# Patient Record
Sex: Male | Born: 1970 | Race: White | Hispanic: No | Marital: Married | Smoking: Never smoker
Health system: Southern US, Community
[De-identification: ages and names within clinical notes are randomized; demographics above are authoritative.]

## PROBLEM LIST (undated history)

## (undated) DIAGNOSIS — M51369 Other intervertebral disc degeneration, lumbar region without mention of lumbar back pain or lower extremity pain: Secondary | ICD-10-CM

## (undated) DIAGNOSIS — M5412 Radiculopathy, cervical region: Secondary | ICD-10-CM

## (undated) DIAGNOSIS — I10 Essential (primary) hypertension: Secondary | ICD-10-CM

## (undated) DIAGNOSIS — Z6839 Body mass index (BMI) 39.0-39.9, adult: Secondary | ICD-10-CM

## (undated) HISTORY — PX: HERNIA REPAIR: SHX51

## (undated) HISTORY — PX: HYDROCELE EXCISION / REPAIR: SUR1145

## (undated) HISTORY — DX: Essential (primary) hypertension: I10

---

## 2006-01-02 ENCOUNTER — Emergency Department: Payer: Self-pay | Admitting: Emergency Medicine

## 2006-10-14 HISTORY — PX: KNEE CARTILAGE SURGERY: SHX688

## 2007-07-28 IMAGING — CR DG TIBIA/FIBULA 2V*L*
1 series · 3 of 3 positions shown · non-contrast
Comparison: none

REASON FOR EXAM: Having pain after getting leg caught between two cars at
work
COMMENTS:

PROCEDURE:     DXR - DXR TIBIA AND FIBULA LT (LOWER L  - January 02, 2006  [DATE]
RESULT:     AP and lateral views of the LEFT tibia and fibula show no
fracture or other significant osseous abnormality.  No radiodense soft
tissue foreign body is seen.

[Series 1: view not recorded · 0.17mm/px · 3 of 3 slices shown]
[im 1/3]
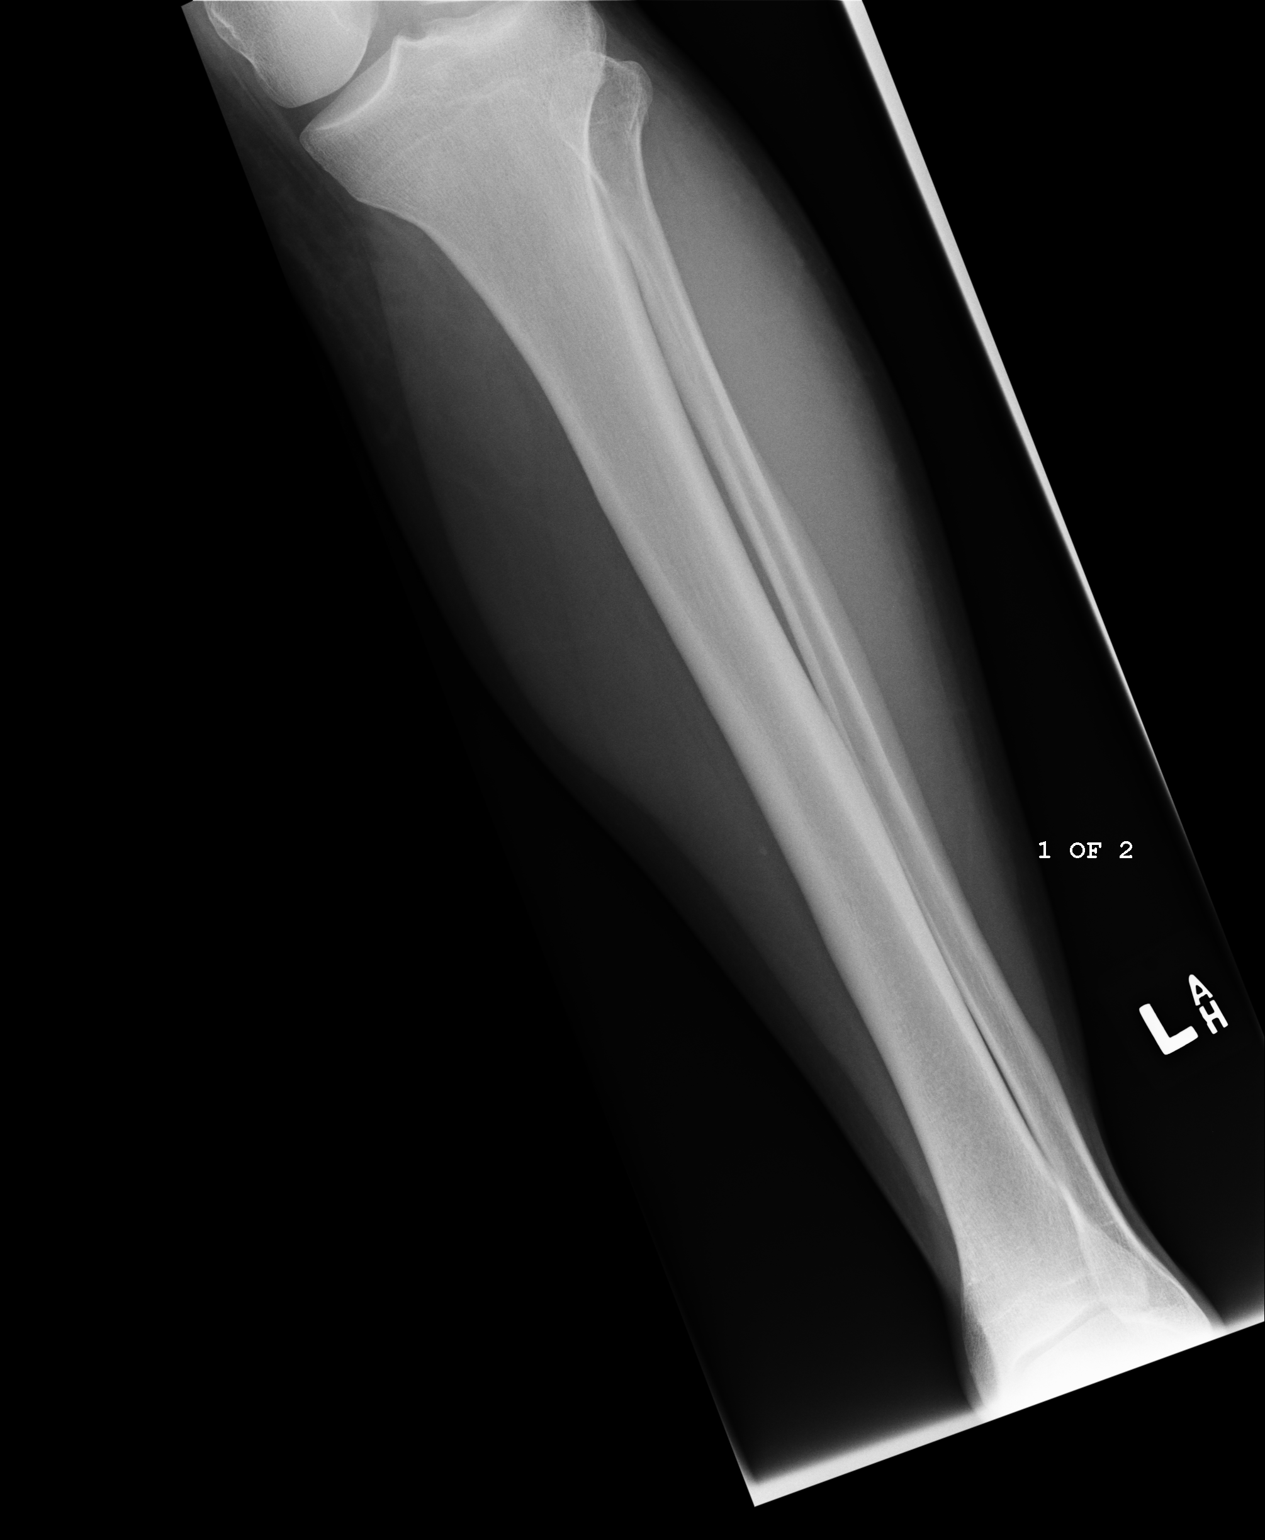
[im 2/3]
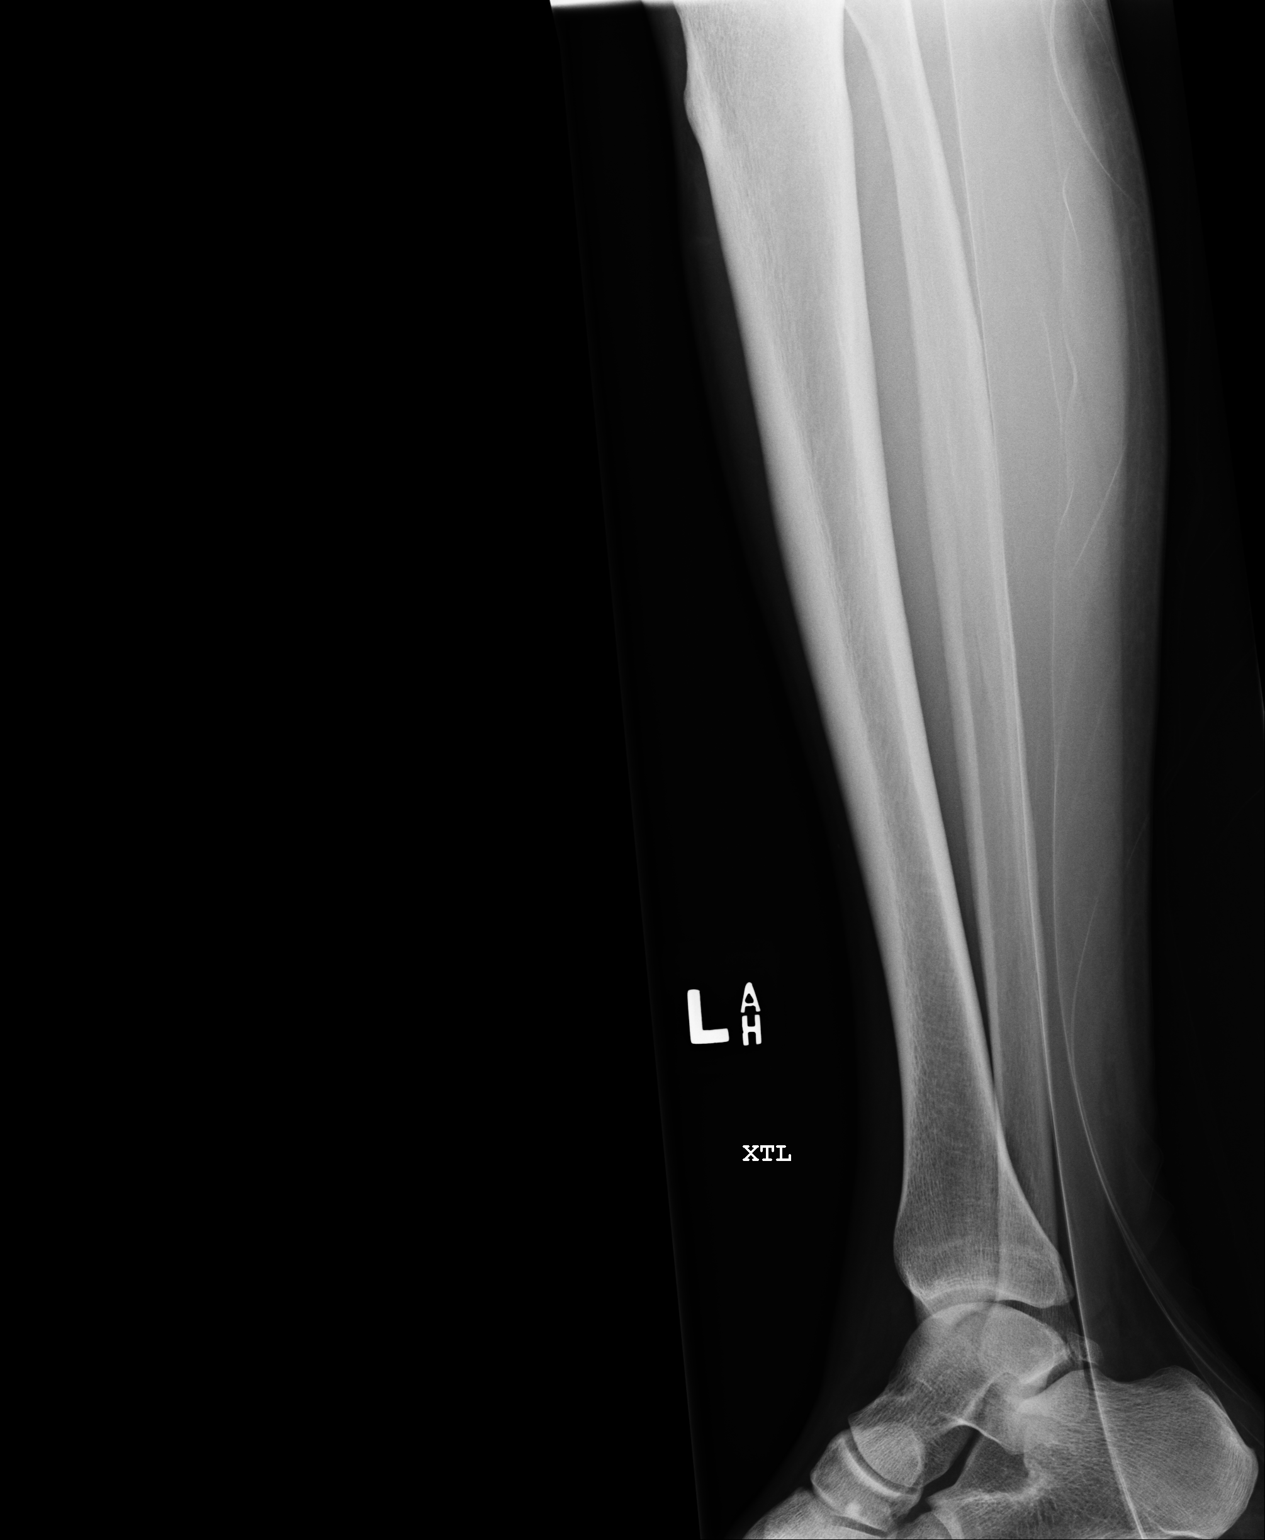
[im 3/3]
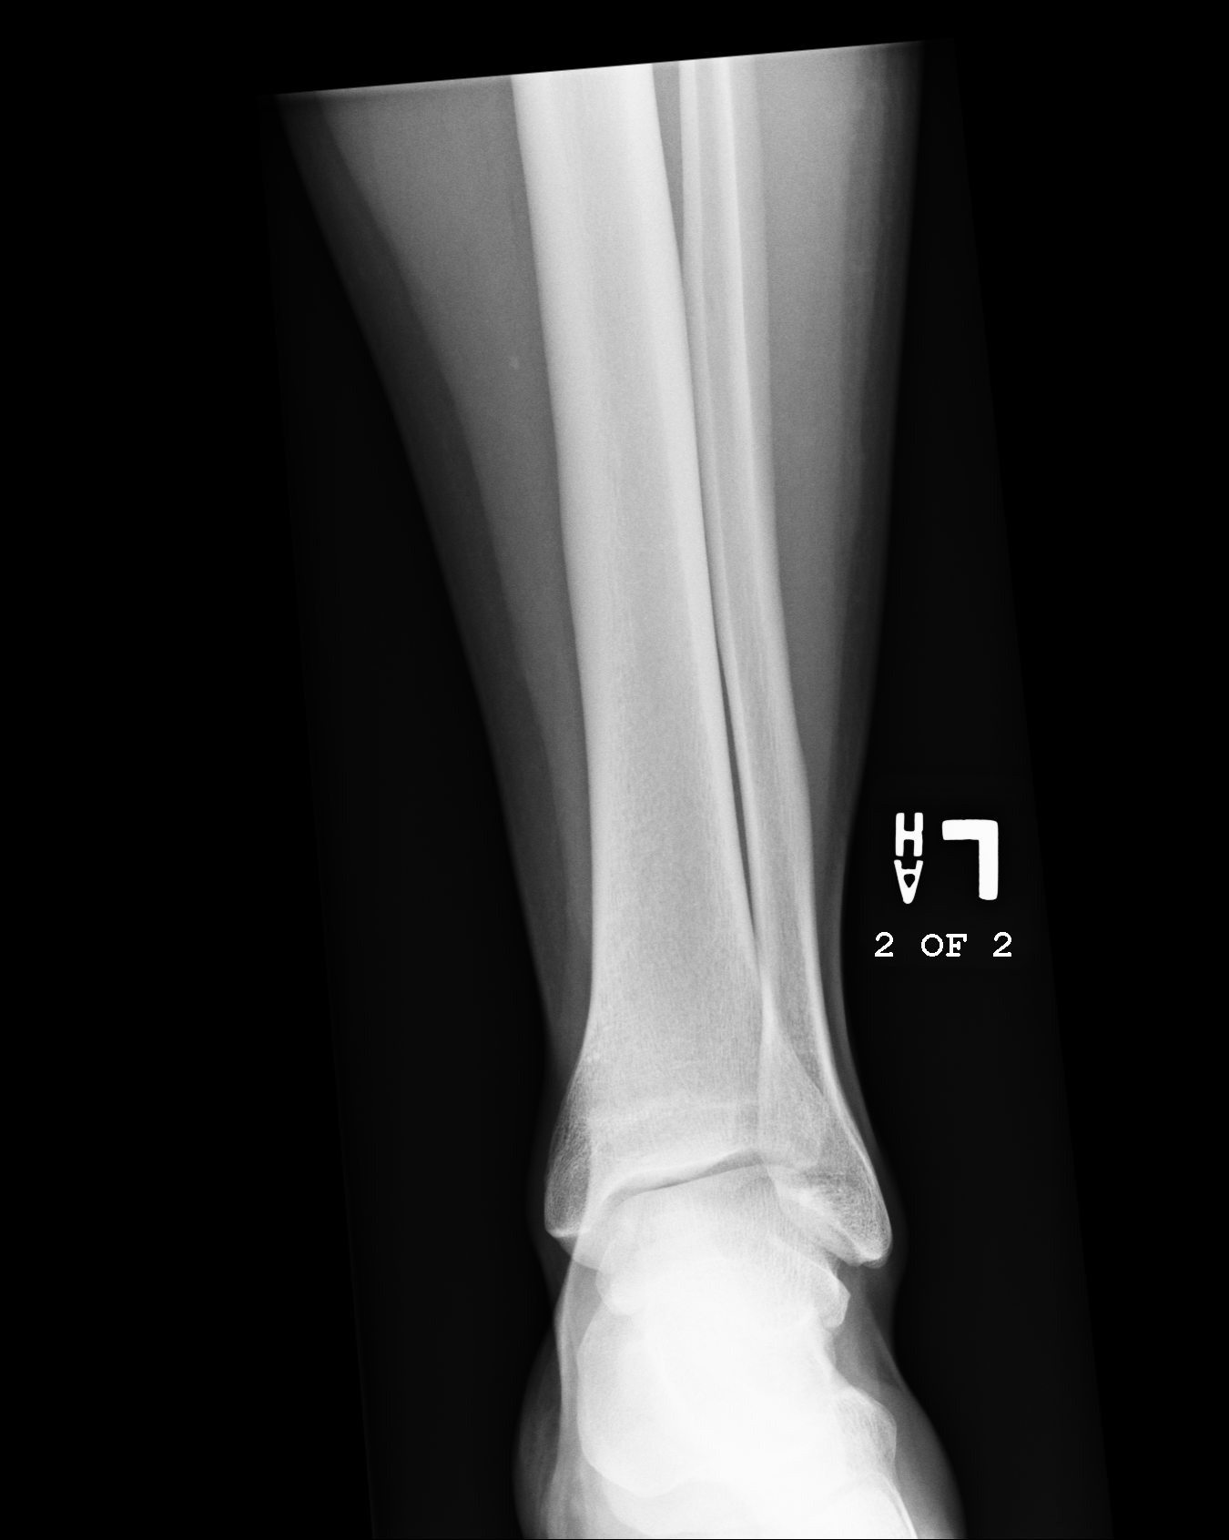

[3 of 3 positions shown; findings below may reference images not displayed]

IMPRESSION: No significant abnormalities are noted.

## 2007-07-28 IMAGING — CR DG KNEE 1-2V*L*
1 series · 2 of 2 positions shown · non-contrast
Comparison: none

REASON FOR EXAM: Pain after getting knee caught between two cars at work
COMMENTS:

PROCEDURE:     DXR - DXR KNEE LEFT AP AND LATERAL  - January 02, 2006  [DATE]
RESULT:          AP and lateral views of the LEFT knee show no fracture,
dislocation, or other acute bony abnormality.

[Series 1: view not recorded · 0.17mm/px · 2 of 2 slices shown]
[im 1/2]
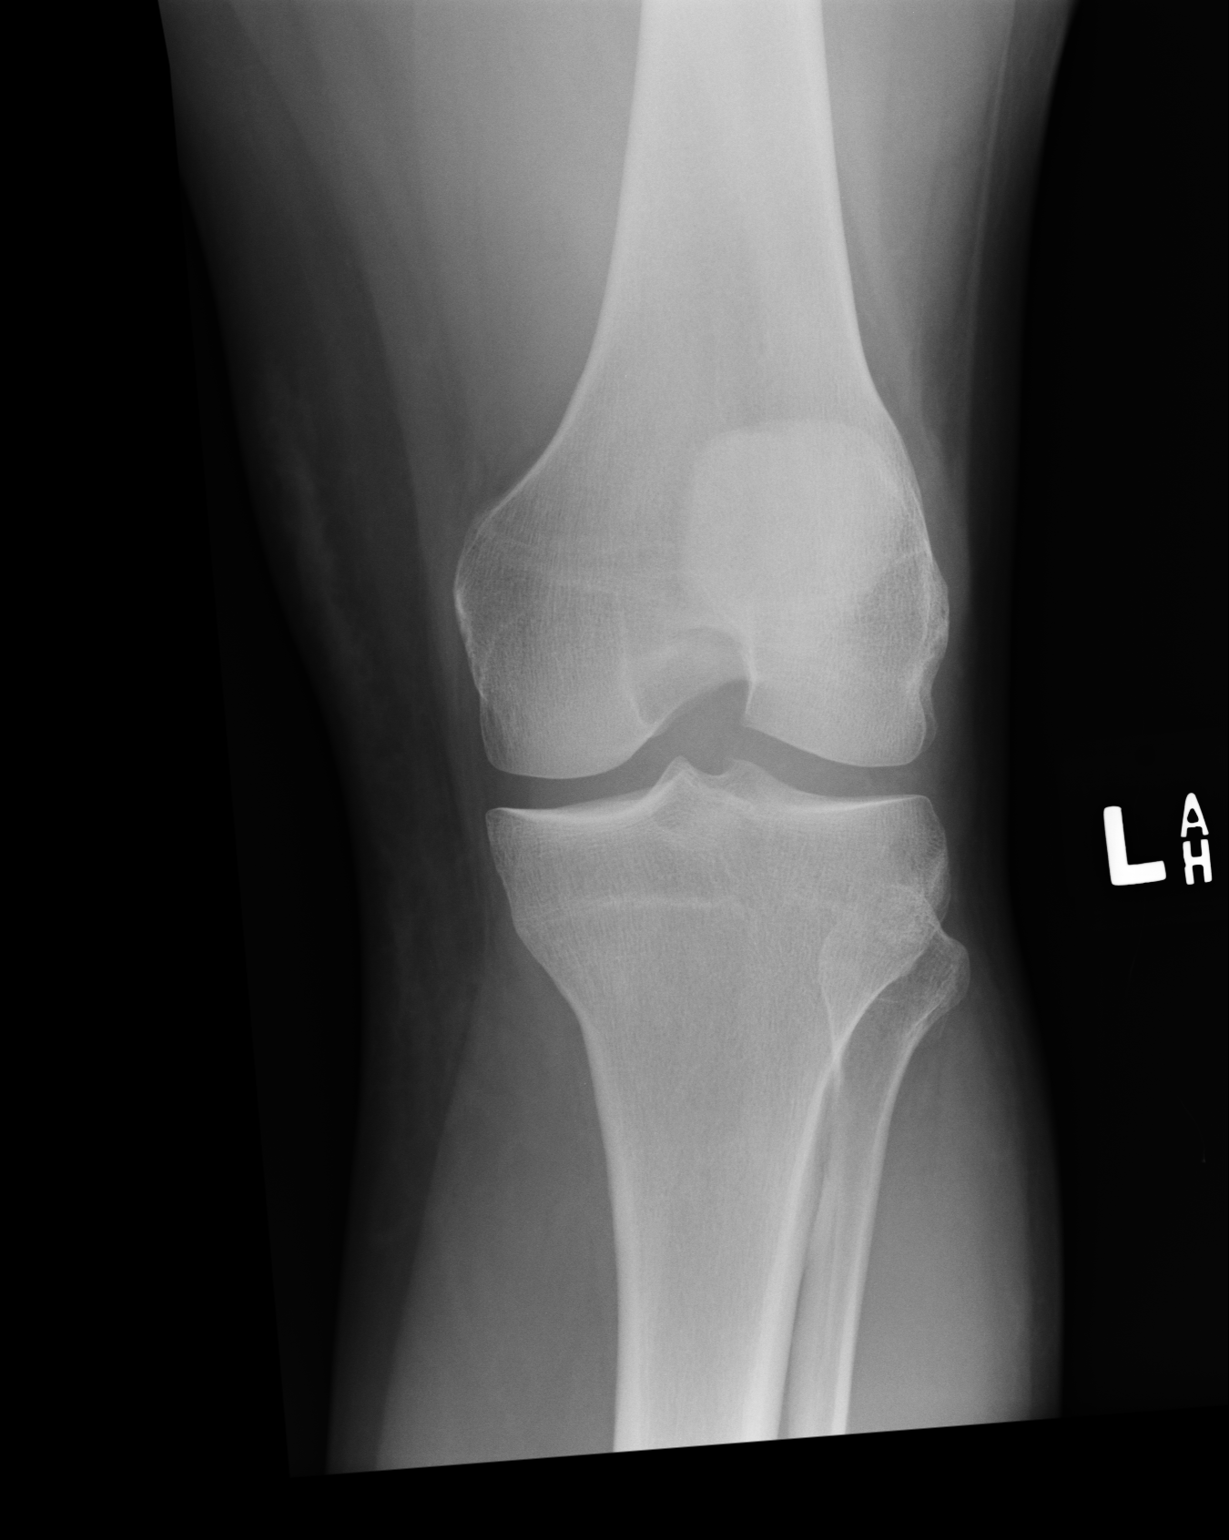
[im 2/2]
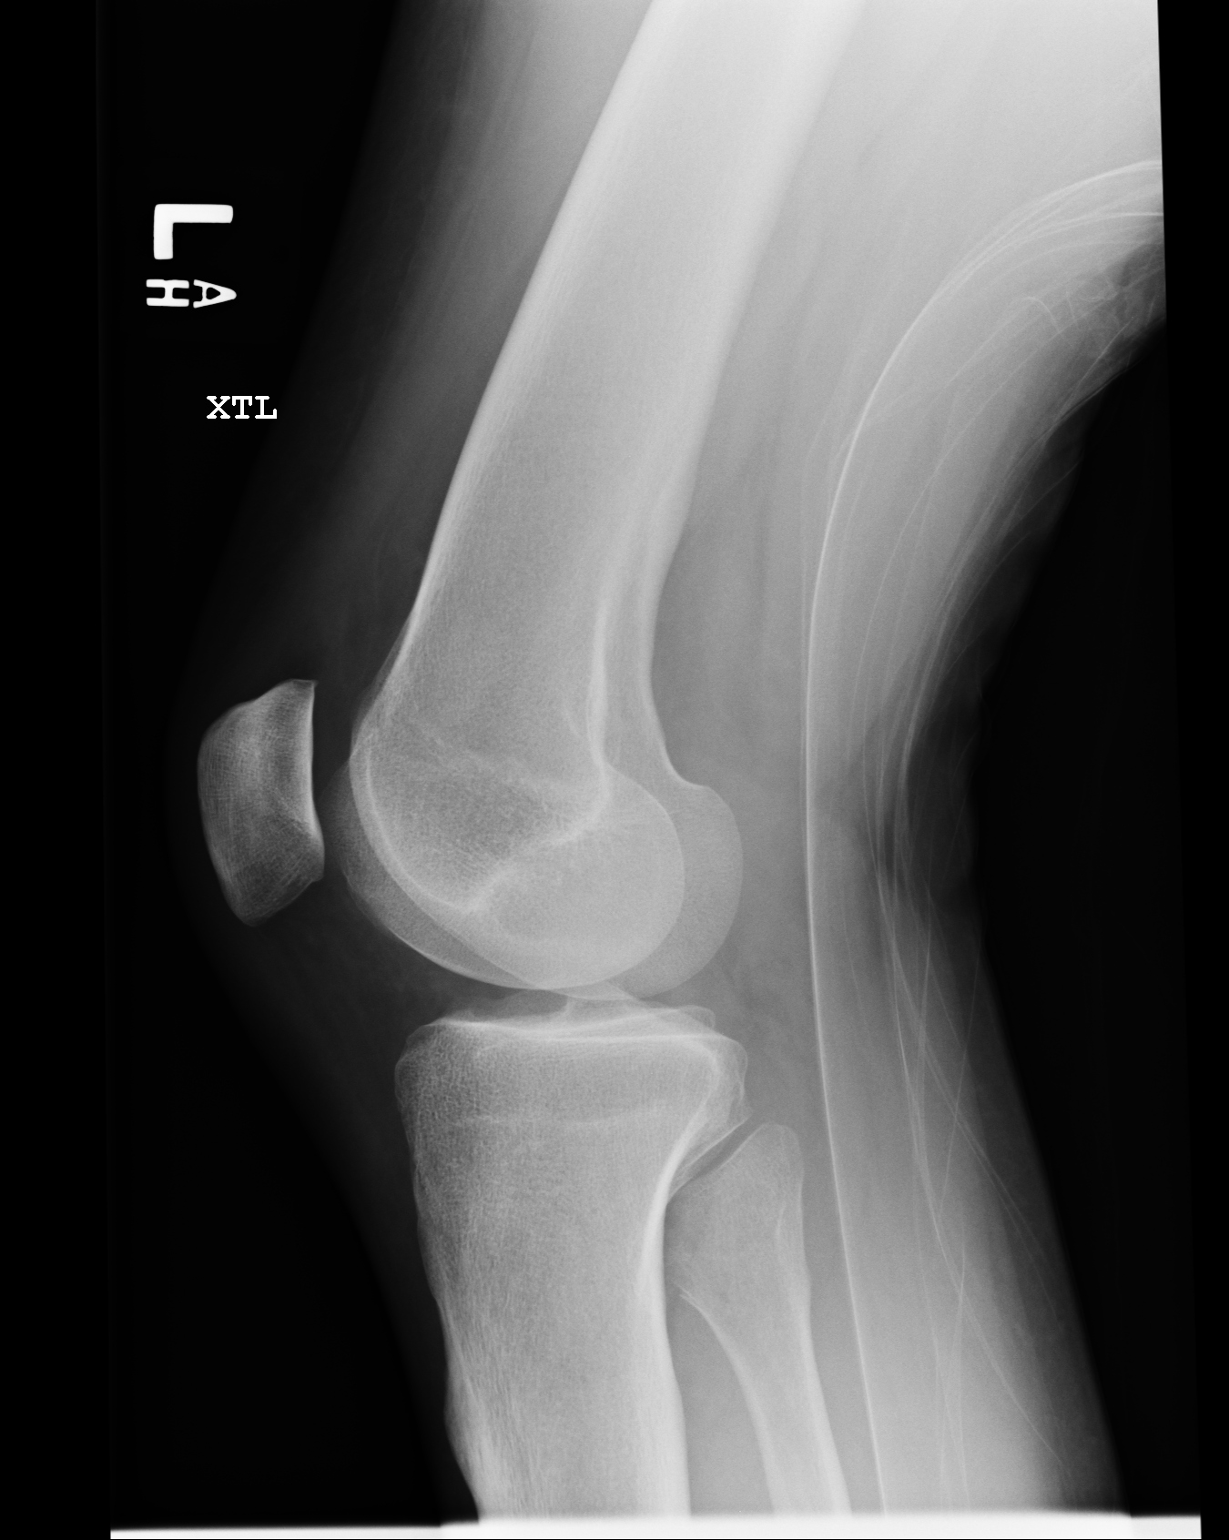

[2 of 2 positions shown; findings below may reference images not displayed]

IMPRESSION: No significant osseous abnormalities are noted.

## 2015-03-29 ENCOUNTER — Ambulatory Visit (INDEPENDENT_AMBULATORY_CARE_PROVIDER_SITE_OTHER): Payer: 59 | Admitting: Family Medicine

## 2015-03-29 ENCOUNTER — Encounter: Payer: Self-pay | Admitting: Family Medicine

## 2015-03-29 ENCOUNTER — Other Ambulatory Visit: Payer: 59

## 2015-03-29 VITALS — BP 138/84 | HR 59 | Temp 97.7°F | Ht 70.0 in | Wt 218.0 lb

## 2015-03-29 DIAGNOSIS — Z0000 Encounter for general adult medical examination without abnormal findings: Secondary | ICD-10-CM | POA: Diagnosis not present

## 2015-03-29 DIAGNOSIS — I10 Essential (primary) hypertension: Secondary | ICD-10-CM

## 2015-03-29 LAB — URINALYSIS, ROUTINE W REFLEX MICROSCOPIC
Bilirubin, UA: NEGATIVE
Glucose, UA: NEGATIVE
Ketones, UA: NEGATIVE
LEUKOCYTES UA: NEGATIVE
Nitrite, UA: NEGATIVE
Protein, UA: NEGATIVE
RBC, UA: NEGATIVE
SPEC GRAV UA: 1.03 (ref 1.005–1.030)
Urobilinogen, Ur: 0.2 mg/dL (ref 0.2–1.0)
pH, UA: 5.5 (ref 5.0–7.5)

## 2015-03-29 MED ORDER — AMLODIPINE BESYLATE 5 MG PO TABS: 5.0000 mg | ORAL_TABLET | Freq: Every day | ORAL | Status: DC

## 2015-03-29 NOTE — Assessment & Plan Note (Signed)
The current medical regimen is effective;  continue present plan and medications.  

## 2015-03-29 NOTE — Progress Notes (Signed)
   BP 138/84 mmHg  Pulse 59  Temp(Src) 97.7 F (36.5 C)  Ht 5\' 10"  (1.778 m)  Wt 218 lb (98.884 kg)  BMI 31.28 kg/m2  SpO2 96%   Subjective:    Patient ID: Isaiah Terrell, male    DOB: Jan 19, 1971, 44 y.o.   MRN: [id number]  HPI: Isaiah Terrell is a 44 y.o. male  Chief Complaint  Patient presents with  . Annual Exam  bp doing well meds no issues  Relevant past medical, surgical, family and social history reviewed and updated as indicated. Interim medical history since our last visit reviewed. Allergies and medications reviewed and updated.  Review of Systems  Constitutional: Negative.   HENT: Negative.   Eyes: Negative.   Respiratory: Negative.   Cardiovascular: Negative.   Endocrine: Negative.   Musculoskeletal: Negative.   Skin: Negative.   Allergic/Immunologic: Negative.   Neurological: Negative.   Hematological: Negative.   Psychiatric/Behavioral: Negative.     Per HPI unless specifically indicated above     Objective:    BP 138/84 mmHg  Pulse 59  Temp(Src) 97.7 F (36.5 C)  Ht 5\' 10"  (1.778 m)  Wt 218 lb (98.884 kg)  BMI 31.28 kg/m2  SpO2 96%  Wt Readings from Last 3 Encounters:  03/29/15 218 lb (98.884 kg)  10/19/14 228 lb (103.42 kg)    Physical Exam  Constitutional: He is oriented to person, place, and time. He appears well-developed and well-nourished.  HENT:  Head: Normocephalic and atraumatic.  Right Ear: External ear normal.  Left Ear: External ear normal.  Eyes: Conjunctivae and EOM are normal. Pupils are equal, round, and reactive to light.  Neck: Normal range of motion. Neck supple.  Cardiovascular: Normal rate, regular rhythm, normal heart sounds and intact distal pulses.   Pulmonary/Chest: Effort normal and breath sounds normal.  Abdominal: Soft. Bowel sounds are normal. There is no splenomegaly or hepatomegaly.  Genitourinary: Rectum normal, prostate normal and penis normal.  Musculoskeletal: Normal range of motion.  Neurological: He is  alert and oriented to person, place, and time. He has normal reflexes.  Skin: No rash noted. No erythema.  Psychiatric: He has a normal mood and affect. His behavior is normal. Judgment and thought content normal.    No results found for this or any previous visit.    Assessment & Plan:   Problem List Items Addressed This Visit      Cardiovascular and Mediastinum   Hypertension - Primary (Chronic)    The current medical regimen is effective;  continue present plan and medications.       Relevant Medications   amLODipine (NORVASC) 5 MG tablet   Other Relevant Orders   Basic metabolic panel   CBC with Differential/Platelet   Comprehensive metabolic panel   PSA   Lipid panel   TSH   Urinalysis, Routine w reflex microscopic (not at [facility])    Other Visit Diagnoses    PE (physical exam), annual        Relevant Orders    Basic metabolic panel    CBC with Differential/Platelet    Comprehensive metabolic panel    PSA    Lipid panel    TSH    Urinalysis, Routine w reflex microscopic (not at [facility])        Follow up plan: Return in about 6 months (around 09/28/2015) for BP.

## 2015-03-30 LAB — LIPID PANEL
Chol/HDL Ratio: 5.7 ratio units — ABNORMAL HIGH (ref 0.0–5.0)
Cholesterol, Total: 187 mg/dL (ref 100–199)
HDL: 33 mg/dL — ABNORMAL LOW (ref >39)
LDL Calculated: 114 mg/dL — ABNORMAL HIGH (ref 0–99)
TRIGLYCERIDES: 200 mg/dL — AB (ref 0–149)
VLDL Cholesterol Cal: 40 mg/dL (ref 5–40)

## 2015-03-30 LAB — CBC WITH DIFFERENTIAL/PLATELET
Basophils Absolute: 0 10*3/uL (ref 0.0–0.2)
Basos: 0 %
EOS (ABSOLUTE): 0 10*3/uL (ref 0.0–0.4)
EOS: 0 %
Hematocrit: 45.9 % (ref 37.5–51.0)
Hemoglobin: 15.9 g/dL (ref 12.6–17.7)
IMMATURE GRANULOCYTES: 0 %
Immature Grans (Abs): 0 10*3/uL (ref 0.0–0.1)
LYMPHS: 12 %
Lymphocytes Absolute: 1.2 10*3/uL (ref 0.7–3.1)
MCH: 29.4 pg (ref 26.6–33.0)
MCHC: 34.6 g/dL (ref 31.5–35.7)
MCV: 85 fL (ref 79–97)
Monocytes Absolute: 0.3 10*3/uL (ref 0.1–0.9)
Monocytes: 3 %
Neutrophils Absolute: 8.5 10*3/uL — ABNORMAL HIGH (ref 1.4–7.0)
Neutrophils: 85 %
PLATELETS: 275 10*3/uL (ref 150–379)
RBC: 5.41 x10E6/uL (ref 4.14–5.80)
RDW: 13.9 % (ref 12.3–15.4)
WBC: 10 10*3/uL (ref 3.4–10.8)

## 2015-03-30 LAB — COMPREHENSIVE METABOLIC PANEL
A/G RATIO: 1.9 (ref 1.1–2.5)
ALT: 35 IU/L (ref 0–44)
AST: 23 IU/L (ref 0–40)
Albumin: 4.5 g/dL (ref 3.5–5.5)
Alkaline Phosphatase: 72 IU/L (ref 39–117)
BUN / CREAT RATIO: 16 (ref 9–20)
BUN: 15 mg/dL (ref 6–24)
Bilirubin Total: 0.8 mg/dL (ref 0.0–1.2)
CO2: 23 mmol/L (ref 18–29)
CREATININE: 0.94 mg/dL (ref 0.76–1.27)
Calcium: 10.1 mg/dL (ref 8.7–10.2)
Chloride: 97 mmol/L (ref 97–108)
GFR, EST AFRICAN AMERICAN: 113 mL/min/{1.73_m2} (ref >59)
GFR, EST NON AFRICAN AMERICAN: 98 mL/min/{1.73_m2} (ref >59)
GLUCOSE: 104 mg/dL — AB (ref 65–99)
Globulin, Total: 2.4 g/dL (ref 1.5–4.5)
Potassium: 4.8 mmol/L (ref 3.5–5.2)
Sodium: 137 mmol/L (ref 134–144)
Total Protein: 6.9 g/dL (ref 6.0–8.5)

## 2015-03-30 LAB — PSA: Prostate Specific Ag, Serum: 1.1 ng/mL (ref 0.0–4.0)

## 2015-03-30 LAB — TSH: TSH: 2.62 u[IU]/mL (ref 0.450–4.500)

## 2015-09-27 ENCOUNTER — Ambulatory Visit (INDEPENDENT_AMBULATORY_CARE_PROVIDER_SITE_OTHER): Payer: 59 | Admitting: Family Medicine

## 2015-09-27 ENCOUNTER — Encounter: Payer: Self-pay | Admitting: Family Medicine

## 2015-09-27 VITALS — BP 139/87 | HR 84 | Temp 98.4°F | Ht 70.5 in | Wt 228.0 lb

## 2015-09-27 DIAGNOSIS — I10 Essential (primary) hypertension: Secondary | ICD-10-CM | POA: Diagnosis not present

## 2015-09-27 DIAGNOSIS — M545 Low back pain, unspecified: Secondary | ICD-10-CM

## 2015-09-27 NOTE — Assessment & Plan Note (Signed)
Borderline control of blood pressure will do diet and exercise modification Discuss modest salt etc. If blood pressure not coming down we will reevaluate consider different medical therapies

## 2015-09-27 NOTE — Progress Notes (Signed)
BP 139/87 mmHg  Pulse 84  Temp(Src) 98.4 F (36.9 C)  Ht 5' 10.5" (1.791 m)  Wt 228 lb (103.42 kg)  BMI 32.24 kg/m2  SpO2 97%   Subjective:    Patient ID: Isaiah Terrell, male    DOB: 05/19/1971, 44 y.o.   MRN: [id number]  HPI: Isaiah Terrell is a 44 y.o. male  Chief Complaint  Patient presents with  . Hypertension   Patient recheck hypertension doing well has had 10 pound weight gain without problems but is noted blood pressure readings similar to what we have today borderline to high. On chart review previous blood pressure 10 pounds lighter were okay  Patient also notes concern with some left lower back pain from time to time with marked catching sensation with feeling like it's going to drop to his knees but does not times last for a few seconds to a minute and goes away and comes on from time to time. No blood in stool or urine  Relevant past medical, surgical, family and social history reviewed and updated as indicated. Interim medical history since our last visit reviewed. Allergies and medications reviewed and updated.  Review of Systems  Constitutional: Negative.   Respiratory: Negative.   Cardiovascular: Negative.     Per HPI unless specifically indicated above     Objective:    BP 139/87 mmHg  Pulse 84  Temp(Src) 98.4 F (36.9 C)  Ht 5' 10.5" (1.791 m)  Wt 228 lb (103.42 kg)  BMI 32.24 kg/m2  SpO2 97%  Wt Readings from Last 3 Encounters:  09/27/15 228 lb (103.42 kg)  03/29/15 218 lb (98.884 kg)  10/19/14 228 lb (103.42 kg)    Physical Exam  Constitutional: He is oriented to person, place, and time. He appears well-developed and well-nourished. No distress.  HENT:  Head: Normocephalic and atraumatic.  Right Ear: Hearing normal.  Left Ear: Hearing normal.  Nose: Nose normal.  Eyes: Conjunctivae and lids are normal. Right eye exhibits no discharge. Left eye exhibits no discharge. No scleral icterus.  Cardiovascular: Normal rate, regular rhythm and  normal heart sounds.   Pulmonary/Chest: Effort normal and breath sounds normal. No respiratory distress.  Musculoskeletal: Normal range of motion.  Back area clear with no rash noted tenderness no deformities DTRs straight leg raising all normal no neurological deficit  Neurological: He is alert and oriented to person, place, and time.  Skin: Skin is intact. No rash noted.  Psychiatric: He has a normal mood and affect. His speech is normal and behavior is normal. Judgment and thought content normal. Cognition and memory are normal.    Results for orders placed or performed in visit on 03/29/15  CBC with Differential/Platelet  Result Value Ref Range   WBC 10.0 3.4 - 10.8 x10E3/uL   RBC 5.41 4.14 - 5.80 x10E6/uL   Hemoglobin 15.9 12.6 - 17.7 g/dL   Hematocrit 11.945.9 14.737.5 - 51.0 %   MCV 85 79 - 97 fL   MCH 29.4 26.6 - 33.0 pg   MCHC 34.6 31.5 - 35.7 g/dL   RDW 82.913.9 56.212.3 - 13.015.4 %   Platelets 275 150 - 379 x10E3/uL   Neutrophils 85 %   Lymphs 12 %   Monocytes 3 %   Eos 0 %   Basos 0 %   Neutrophils Absolute 8.5 (H) 1.4 - 7.0 x10E3/uL   Lymphocytes Absolute 1.2 0.7 - 3.1 x10E3/uL   Monocytes Absolute 0.3 0.1 - 0.9 x10E3/uL   EOS (ABSOLUTE) 0.0 0.0 -  0.4 x10E3/uL   Basophils Absolute 0.0 0.0 - 0.2 x10E3/uL   Immature Granulocytes 0 %   Immature Grans (Abs) 0.0 0.0 - 0.1 x10E3/uL  Comprehensive metabolic panel  Result Value Ref Range   Glucose 104 (H) 65 - 99 mg/dL   BUN 15 6 - 24 mg/dL   Creatinine, Ser 4.09 0.76 - 1.27 mg/dL   GFR calc non Af Amer 98 >59 mL/min/1.73   GFR calc Af Amer 113 >59 mL/min/1.73   BUN/Creatinine Ratio 16 9 - 20   Sodium 137 134 - 144 mmol/L   Potassium 4.8 3.5 - 5.2 mmol/L   Chloride 97 97 - 108 mmol/L   CO2 23 18 - 29 mmol/L   Calcium 10.1 8.7 - 10.2 mg/dL   Total Protein 6.9 6.0 - 8.5 g/dL   Albumin 4.5 3.5 - 5.5 g/dL   Globulin, Total 2.4 1.5 - 4.5 g/dL   Albumin/Globulin Ratio 1.9 1.1 - 2.5   Bilirubin Total 0.8 0.0 - 1.2 mg/dL   Alkaline  Phosphatase 72 39 - 117 IU/L   AST 23 0 - 40 IU/L   ALT 35 0 - 44 IU/L  PSA  Result Value Ref Range   Prostate Specific Ag, Serum 1.1 0.0 - 4.0 ng/mL  Lipid panel  Result Value Ref Range   Cholesterol, Total 187 100 - 199 mg/dL   Triglycerides 811 (H) 0 - 149 mg/dL   HDL 33 (L) >91 mg/dL   VLDL Cholesterol Cal 40 5 - 40 mg/dL   LDL Calculated 478 (H) 0 - 99 mg/dL   Chol/HDL Ratio 5.7 (H) 0.0 - 5.0 ratio units  TSH  Result Value Ref Range   TSH 2.620 0.450 - 4.500 uIU/mL  Urinalysis, Routine w reflex microscopic (not at [facility])  Result Value Ref Range   Specific Gravity, UA 1.030 1.005 - 1.030   pH, UA 5.5 5.0 - 7.5   Color, UA Yellow Yellow   Appearance Ur Clear Clear   Leukocytes, UA Negative Negative   Protein, UA Negative Negative/Trace   Glucose, UA Negative Negative   Ketones, UA Negative Negative   RBC, UA Negative Negative   Bilirubin, UA Negative Negative   Urobilinogen, Ur 0.2 0.2 - 1.0 mg/dL   Nitrite, UA Negative Negative      Assessment & Plan:   Problem List Items Addressed This Visit      Cardiovascular and Mediastinum   Hypertension - Primary (Chronic)    Borderline control of blood pressure will do diet and exercise modification Discuss modest salt etc. If blood pressure not coming down we will reevaluate consider different medical therapies      Relevant Orders   Basic metabolic panel    Other Visit Diagnoses    Left-sided low back pain without sciatica        Discuss back pain care and treatment posture exercise        Follow up plan: Return in about 6 months (around 03/27/2016), or if symptoms worsen or fail to improve, for Physical Exam.

## 2015-09-28 ENCOUNTER — Encounter: Payer: Self-pay | Admitting: Family Medicine

## 2015-09-28 LAB — BASIC METABOLIC PANEL
BUN / CREAT RATIO: 16 (ref 9–20)
BUN: 15 mg/dL (ref 6–24)
CO2: 24 mmol/L (ref 18–29)
CREATININE: 0.93 mg/dL (ref 0.76–1.27)
Calcium: 9.9 mg/dL (ref 8.7–10.2)
Chloride: 101 mmol/L (ref 96–106)
GFR calc Af Amer: 114 mL/min/{1.73_m2} (ref >59)
GFR, EST NON AFRICAN AMERICAN: 99 mL/min/{1.73_m2} (ref >59)
Glucose: 88 mg/dL (ref 65–99)
POTASSIUM: 4.9 mmol/L (ref 3.5–5.2)
Sodium: 138 mmol/L (ref 134–144)

## 2016-04-03 ENCOUNTER — Ambulatory Visit (INDEPENDENT_AMBULATORY_CARE_PROVIDER_SITE_OTHER): Payer: 59 | Admitting: Family Medicine

## 2016-04-03 ENCOUNTER — Encounter: Payer: Self-pay | Admitting: Family Medicine

## 2016-04-03 VITALS — BP 132/85 | HR 69 | Temp 98.0°F | Ht 71.0 in | Wt 227.0 lb

## 2016-04-03 DIAGNOSIS — I10 Essential (primary) hypertension: Secondary | ICD-10-CM | POA: Diagnosis not present

## 2016-04-03 DIAGNOSIS — Z0000 Encounter for general adult medical examination without abnormal findings: Secondary | ICD-10-CM | POA: Diagnosis not present

## 2016-04-03 LAB — URINALYSIS, ROUTINE W REFLEX MICROSCOPIC
Bilirubin, UA: NEGATIVE
GLUCOSE, UA: NEGATIVE
KETONES UA: NEGATIVE
Leukocytes, UA: NEGATIVE
Nitrite, UA: NEGATIVE
PROTEIN UA: NEGATIVE
RBC, UA: NEGATIVE
Specific Gravity, UA: <1.005 — ABNORMAL LOW (ref 1.005–1.030)
UUROB: 0.2 mg/dL (ref 0.2–1.0)
pH, UA: 5 (ref 5.0–7.5)

## 2016-04-03 MED ORDER — AMLODIPINE BESYLATE 5 MG PO TABS: 5.0000 mg | ORAL_TABLET | Freq: Every day | ORAL | Status: DC

## 2016-04-03 NOTE — Addendum Note (Signed)
Addended by: Lurlean HornsWILSON, NANCY H on: 04/03/2016 08:25 AM   Modules accepted: K[employer]od[employer]Set

## 2016-04-03 NOTE — Assessment & Plan Note (Signed)
The current medical regimen is effective;  continue present plan and medications.  

## 2016-04-03 NOTE — Progress Notes (Signed)
BP 132/85 mmHg  Pulse 69  Temp(Src) 98 F (36.7 C)  Ht  (1.803 m)  Wt 227 lb (102.967 kg)  BMI 31.67 kg/m2  SpO2 96%   Subjective:    Patient ID: Isaiah Terrell, male    DOB: [birth year], 45 y.o.   MRN: [id number]  HPI: Isaiah Terrell is a 45 y.o. male  Chief Complaint  Patient presents with  . Annual Exam  Pressure doing well with no complaints taking medications faithfully and good control of blood pressure.  Relevant past medical, surgical, family and social history reviewed and updated as indicated. Interim medical history since our last visit reviewed. Allergies and medications reviewed and updated.  Review of Systems  Constitutional: Negative.   HENT: Negative.   Eyes: Negative.   Respiratory: Negative.   Cardiovascular: Negative.   Gastrointestinal: Negative.   Endocrine: Negative.   Genitourinary: Negative.   Musculoskeletal: Negative.   Skin: Negative.   Allergic/Immunologic: Negative.   Neurological: Negative.   Hematological: Negative.   Psychiatric/Behavioral: Negative.     Per HPI unless specifically indicated above     Objective:    BP 132/85 mmHg  Pulse 69  Temp(Src) 98 F (36.7 C)  Ht  (1.803 m)  Wt 227 lb (102.967 kg)  BMI 31.67 kg/m2  SpO2 96%  Wt Readings from Last 3 Encounters:  04/03/16 227 lb (102.967 kg)  09/27/15 228 lb (103.42 kg)  03/29/15 218 lb (98.884 kg)    Physical Exam  Constitutional: He is oriented to person, place, and time. He appears well-developed and well-nourished.  HENT:  Head: Normocephalic and atraumatic.  Right Ear: External ear normal.  Left Ear: External ear normal.  Eyes: Conjunctivae and EOM are normal. Pupils are equal, round, and reactive to light.  Neck: Normal range of motion. Neck supple.  Cardiovascular: Normal rate, regular rhythm, normal heart sounds and intact distal pulses.   Pulmonary/Chest: Effort normal and breath sounds normal.  Abdominal: Soft. Bowel sounds are normal. There is no  splenomegaly or hepatomegaly.  Genitourinary: Rectum normal, prostate normal and penis normal.  Musculoskeletal: Normal range of motion.  Neurological: He is alert and oriented to person, place, and time. He has normal reflexes.  Skin: No rash noted. No erythema.  Psychiatric: He has a normal mood and affect. His behavior is normal. Judgment and thought content normal.    Results for orders placed or performed in visit on 09/27/15  Basic metabolic panel  Result Value Ref Range   Glucose 88 65 - 99 mg/dL   BUN 15 6 - 24 mg/dL   Creatinine, Ser 7.82 0.76 - 1.27 mg/dL   GFR calc non Af Amer 99 >59 mL/min/1.73   GFR calc Af Amer 114 >59 mL/min/1.73   BUN/Creatinine Ratio 16 9 - 20   Sodium 138 134 - 144 mmol/L   Potassium 4.9 3.5 - 5.2 mmol/L   Chloride 101 96 - 106 mmol/L   CO2 24 18 - 29 mmol/L   Calcium 9.9 8.7 - 10.2 mg/dL      Assessment & Plan:   Problem List Items Addressed This Visit      Cardiovascular and Mediastinum   Essential hypertension    The current medical regimen is effective;  continue present plan and medications.       Relevant Medications   amLODipine (NORVASC) 5 MG tablet    Other Visit Diagnoses    Routine general medical examination at a health care facility    -  Primary    Relevant Orders    CBC with Differential/Platelet    Comprehensive metabolic panel    Lipid Panel w/o Chol/HDL Ratio    PSA    TSH    Urinalysis, Routine w reflex microscopic ([facility])      Scuffs continued efforts at weight loss and dietary control  Follow up plan: Return in about 6 months (around 10/03/2016) for BMP.

## 2016-04-04 ENCOUNTER — Encounter: Payer: Self-pay | Admitting: Family Medicine

## 2016-04-04 LAB — TSH: TSH: 2.47 u[IU]/mL (ref 0.450–4.500)

## 2016-04-04 LAB — COMPREHENSIVE METABOLIC PANEL
A/G RATIO: 1.9 (ref 1.2–2.2)
ALT: 30 IU/L (ref 0–44)
AST: 21 IU/L (ref 0–40)
Albumin: 4.5 g/dL (ref 3.5–5.5)
Alkaline Phosphatase: 67 IU/L (ref 39–117)
BUN/Creatinine Ratio: 17 (ref 9–20)
BUN: 16 mg/dL (ref 6–24)
Bilirubin Total: 0.6 mg/dL (ref 0.0–1.2)
CALCIUM: 9.9 mg/dL (ref 8.7–10.2)
CO2: 21 mmol/L (ref 18–29)
CREATININE: 0.96 mg/dL (ref 0.76–1.27)
Chloride: 102 mmol/L (ref 96–106)
GFR, EST AFRICAN AMERICAN: 109 mL/min/{1.73_m2} (ref >59)
GFR, EST NON AFRICAN AMERICAN: 94 mL/min/{1.73_m2} (ref >59)
Globulin, Total: 2.4 g/dL (ref 1.5–4.5)
Glucose: 97 mg/dL (ref 65–99)
POTASSIUM: 4.3 mmol/L (ref 3.5–5.2)
Sodium: 140 mmol/L (ref 134–144)
TOTAL PROTEIN: 6.9 g/dL (ref 6.0–8.5)

## 2016-04-04 LAB — CBC WITH DIFFERENTIAL/PLATELET
BASOS: 0 %
Basophils Absolute: 0 10*3/uL (ref 0.0–0.2)
EOS (ABSOLUTE): 0.1 10*3/uL (ref 0.0–0.4)
EOS: 2 %
HEMATOCRIT: 45.2 % (ref 37.5–51.0)
Hemoglobin: 15.9 g/dL (ref 12.6–17.7)
IMMATURE GRANS (ABS): 0 10*3/uL (ref 0.0–0.1)
IMMATURE GRANULOCYTES: 0 %
LYMPHS: 27 %
Lymphocytes Absolute: 1.4 10*3/uL (ref 0.7–3.1)
MCH: 29.8 pg (ref 26.6–33.0)
MCHC: 35.2 g/dL (ref 31.5–35.7)
MCV: 85 fL (ref 79–97)
Monocytes Absolute: 0.3 10*3/uL (ref 0.1–0.9)
Monocytes: 6 %
NEUTROS PCT: 65 %
Neutrophils Absolute: 3.5 10*3/uL (ref 1.4–7.0)
PLATELETS: 295 10*3/uL (ref 150–379)
RBC: 5.34 x10E6/uL (ref 4.14–5.80)
RDW: 14 % (ref 12.3–15.4)
WBC: 5.3 10*3/uL (ref 3.4–10.8)

## 2016-04-04 LAB — LIPID PANEL W/O CHOL/HDL RATIO
Cholesterol, Total: 187 mg/dL (ref 100–199)
HDL: 34 mg/dL — AB (ref >39)
LDL CALC: 123 mg/dL — AB (ref 0–99)
TRIGLYCERIDES: 151 mg/dL — AB (ref 0–149)
VLDL CHOLESTEROL CAL: 30 mg/dL (ref 5–40)

## 2016-04-04 LAB — PSA: PROSTATE SPECIFIC AG, SERUM: 1.2 ng/mL (ref 0.0–4.0)

## 2016-10-03 ENCOUNTER — Ambulatory Visit (INDEPENDENT_AMBULATORY_CARE_PROVIDER_SITE_OTHER): Payer: 59 | Admitting: Family Medicine

## 2016-10-03 ENCOUNTER — Encounter: Payer: Self-pay | Admitting: Family Medicine

## 2016-10-03 VITALS — BP 131/86 | HR 69 | Temp 98.2°F | Ht 71.2 in | Wt 233.2 lb

## 2016-10-03 DIAGNOSIS — I10 Essential (primary) hypertension: Secondary | ICD-10-CM

## 2016-10-03 NOTE — Assessment & Plan Note (Signed)
The current medical regimen is effective;  continue present plan and medications.  

## 2016-10-03 NOTE — Progress Notes (Signed)
BP 131/86 (BP Location: Left Arm, Patient Position: Sitting, Cuff Size: Large)   Pulse 69   Temp 98.2 F (36.8 C)   Ht 5' 11.2" (1.808 m)   Wt 233 lb 3.2 oz (105.8 kg)   SpO2 98%   BMI 32.34 kg/m    Subjective:    Patient ID: Isaiah Kyleavid Terrell, male    DOB: 04/30/1971, 45 y.o.   MRN: [id number]  HPI: Isaiah Terrell is a 45 y.o. male  Chief Complaint  Patient presents with  . Hypertension  Patient follow-up hypertension doing well no complaints from medications taken faithfully without side effects and good blood pressure control.  Relevant past medical, surgical, family and social history reviewed and updated as indicated. Interim medical history since our last visit reviewed. Allergies and medications reviewed and updated.  Review of Systems  Per HPI unless specifically indicated above     Objective:    BP 131/86 (BP Location: Left Arm, Patient Position: Sitting, Cuff Size: Large)   Pulse 69   Temp 98.2 F (36.8 C)   Ht 5' 11.2" (1.808 m)   Wt 233 lb 3.2 oz (105.8 kg)   SpO2 98%   BMI 32.34 kg/m   Wt Readings from Last 3 Encounters:  10/03/16 233 lb 3.2 oz (105.8 kg)  04/03/16 227 lb (103 kg)  09/27/15 228 lb (103.4 kg)    Physical Exam  Results for orders placed or performed in visit on 04/03/16  CBC with Differential/Platelet  Result Value Ref Range   WBC 5.3 3.4 - 10.8 x10E3/uL   RBC 5.34 4.14 - 5.80 x10E6/uL   Hemoglobin 15.9 12.6 - 17.7 g/dL   Hematocrit 78.245.2 95.637.5 - 51.0 %   MCV 85 79 - 97 fL   MCH 29.8 26.6 - 33.0 pg   MCHC 35.2 31.5 - 35.7 g/dL   RDW 21.314.0 08.612.3 - 57.815.4 %   Platelets 295 150 - 379 x10E3/uL   Neutrophils 65 %   Lymphs 27 %   Monocytes 6 %   Eos 2 %   Basos 0 %   Neutrophils Absolute 3.5 1.4 - 7.0 x10E3/uL   Lymphocytes Absolute 1.4 0.7 - 3.1 x10E3/uL   Monocytes Absolute 0.3 0.1 - 0.9 x10E3/uL   EOS (ABSOLUTE) 0.1 0.0 - 0.4 x10E3/uL   Basophils Absolute 0.0 0.0 - 0.2 x10E3/uL   Immature Granulocytes 0 %   Immature Grans (Abs) 0.0 0.0  - 0.1 x10E3/uL  Comprehensive metabolic panel  Result Value Ref Range   Glucose 97 65 - 99 mg/dL   BUN 16 6 - 24 mg/dL   Creatinine, Ser 4.690.96 0.76 - 1.27 mg/dL   GFR calc non Af Amer 94 >59 mL/min/1.73   GFR calc Af Amer 109 >59 mL/min/1.73   BUN/Creatinine Ratio 17 9 - 20   Sodium 140 134 - 144 mmol/L   Potassium 4.3 3.5 - 5.2 mmol/L   Chloride 102 96 - 106 mmol/L   CO2 21 18 - 29 mmol/L   Calcium 9.9 8.7 - 10.2 mg/dL   Total Protein 6.9 6.0 - 8.5 g/dL   Albumin 4.5 3.5 - 5.5 g/dL   Globulin, Total 2.4 1.5 - 4.5 g/dL   Albumin/Globulin Ratio 1.9 1.2 - 2.2   Bilirubin Total 0.6 0.0 - 1.2 mg/dL   Alkaline Phosphatase 67 39 - 117 IU/L   AST 21 0 - 40 IU/L   ALT 30 0 - 44 IU/L  Lipid Panel w/o Chol/HDL Ratio  Result Value Ref Range  Cholesterol, Total 187 100 - 199 mg/dL   Triglycerides 098151 (H) 0 - 149 mg/dL   HDL 34 (L) >11>39 mg/dL   VLDL Cholesterol Cal 30 5 - 40 mg/dL   LDL Calculated 914123 (H) 0 - 99 mg/dL  PSA  Result Value Ref Range   Prostate Specific Ag, Serum 1.2 0.0 - 4.0 ng/mL  TSH  Result Value Ref Range   TSH 2.470 0.450 - 4.500 uIU/mL  Urinalysis, Routine w reflex microscopic (not at [facility])  Result Value Ref Range   Specific Gravity, UA <1.005 (L) 1.005 - 1.030   pH, UA 5.0 5.0 - 7.5   Color, UA Yellow Yellow   Appearance Ur Clear Clear   Leukocytes, UA Negative Negative   Protein, UA Negative Negative/Trace   Glucose, UA Negative Negative   Ketones, UA Negative Negative   RBC, UA Negative Negative   Bilirubin, UA Negative Negative   Urobilinogen, Ur 0.2 0.2 - 1.0 mg/dL   Nitrite, UA Negative Negative      Assessment & Plan:   Problem List Items Addressed This Visit      Cardiovascular and Mediastinum   Essential hypertension - Primary    The current medical regimen is effective;  continue present plan and medications.       Relevant Orders   Basic metabolic panel       Follow up plan: Return in about 6 months (around 04/03/2017) for Physical  Exam.

## 2016-10-04 ENCOUNTER — Encounter: Payer: Self-pay | Admitting: Family Medicine

## 2016-10-04 LAB — BASIC METABOLIC PANEL
BUN / CREAT RATIO: 17 (ref 9–20)
BUN: 19 mg/dL (ref 6–24)
CALCIUM: 9.9 mg/dL (ref 8.7–10.2)
CO2: 24 mmol/L (ref 18–29)
CREATININE: 1.14 mg/dL (ref 0.76–1.27)
Chloride: 100 mmol/L (ref 96–106)
GFR calc non Af Amer: 77 mL/min/{1.73_m2} (ref >59)
GFR, EST AFRICAN AMERICAN: 89 mL/min/{1.73_m2} (ref >59)
Glucose: 95 mg/dL (ref 65–99)
Potassium: 5.1 mmol/L (ref 3.5–5.2)
Sodium: 139 mmol/L (ref 134–144)

## 2017-04-09 ENCOUNTER — Encounter: Payer: 59 | Admitting: Family Medicine

## 2017-04-10 ENCOUNTER — Other Ambulatory Visit: Payer: Self-pay | Admitting: Family Medicine

## 2017-04-10 DIAGNOSIS — I10 Essential (primary) hypertension: Secondary | ICD-10-CM

## 2017-04-10 NOTE — Telephone Encounter (Signed)
Last OV: 10/03/16 Next OV: 05/06/17  Lab Results  Component Value Date   CHOL 187 04/03/2016   HDL 34 (L) 04/03/2016   LDLCALC 123 (H) 04/03/2016   TRIG 151 (H) 04/03/2016   CHOLHDL 5.7 (H) 03/29/2015   Lab Results  Component Value Date   CREATININE 1.14 10/03/2016   BUN 19 10/03/2016   NA 139 10/03/2016   K 5.1 10/03/2016   CL 100 10/03/2016   CO2 24 10/03/2016

## 2017-05-06 ENCOUNTER — Encounter: Payer: Self-pay | Admitting: Family Medicine

## 2017-05-06 ENCOUNTER — Ambulatory Visit (INDEPENDENT_AMBULATORY_CARE_PROVIDER_SITE_OTHER): Payer: 59 | Admitting: Family Medicine

## 2017-05-06 VITALS — BP 135/92 | HR 77 | Ht 71.0 in | Wt 237.0 lb

## 2017-05-06 DIAGNOSIS — Z0000 Encounter for general adult medical examination without abnormal findings: Secondary | ICD-10-CM

## 2017-05-06 DIAGNOSIS — Z13220 Encounter for screening for lipoid disorders: Secondary | ICD-10-CM | POA: Diagnosis not present

## 2017-05-06 DIAGNOSIS — Z1329 Encounter for screening for other suspected endocrine disorder: Secondary | ICD-10-CM | POA: Diagnosis not present

## 2017-05-06 DIAGNOSIS — I10 Essential (primary) hypertension: Secondary | ICD-10-CM | POA: Diagnosis not present

## 2017-05-06 DIAGNOSIS — Z125 Encounter for screening for malignant neoplasm of prostate: Secondary | ICD-10-CM | POA: Diagnosis not present

## 2017-05-06 DIAGNOSIS — Z131 Encounter for screening for diabetes mellitus: Secondary | ICD-10-CM

## 2017-05-06 MED ORDER — AMLODIPINE BESYLATE 10 MG PO TABS
10.0000 mg | ORAL_TABLET | Freq: Every day | ORAL | 2 refills | Status: DC
Start: 2017-05-06 — End: 2017-05-26

## 2017-05-06 NOTE — Progress Notes (Signed)
BP (!) 135/92   Pulse 77   Ht 5\' 11"  (1.803 m)   Wt 237 lb (107.5 kg)   SpO2 97%   BMI 33.05 kg/m    Subjective:    Patient ID: Isaiah Terrell, male    DOB: 07/24/1971, 46 y.o.   MRN: [id number]  HPI: Isaiah Terrell is a 46 y.o. male  Chief Complaint  Patient presents with  . Annual Exam  She will follow-up doing well has changed jobs and now is mostly sedentary. Has gained 10 pounds over the last year plus. Taking amlodipine without problems no side effects. Especially no edema symptoms. Patient's checks his blood pressure elsewhere and is also been elevated.   Relevant past medical, surgical, family and social history reviewed and updated as indicated. Interim medical history since our last visit reviewed. Allergies and medications reviewed and updated.  Review of Systems  Constitutional: Negative.   HENT: Negative.   Eyes: Negative.   Respiratory: Negative.   Cardiovascular: Negative.   Gastrointestinal: Negative.   Endocrine: Negative.   Genitourinary: Negative.   Musculoskeletal: Negative.   Skin: Negative.   Allergic/Immunologic: Negative.   Neurological: Negative.   Hematological: Negative.   Psychiatric/Behavioral: Negative.     Per HPI unless specifically indicated above     Objective:    BP (!) 135/92   Pulse 77   Ht 5\' 11"  (1.803 m)   Wt 237 lb (107.5 kg)   SpO2 97%   BMI 33.05 kg/m   Wt Readings from Last 3 Encounters:  05/06/17 237 lb (107.5 kg)  10/03/16 233 lb 3.2 oz (105.8 kg)  04/03/16 227 lb (103 kg)    Physical Exam  Constitutional: He is oriented to person, place, and time. He appears well-developed and well-nourished.  HENT:  Head: Normocephalic and atraumatic.  Right Ear: External ear normal.  Left Ear: External ear normal.  Eyes: Pupils are equal, round, and reactive to light. Conjunctivae and EOM are normal.  Neck: Normal range of motion. Neck supple.  Cardiovascular: Normal rate, regular rhythm, normal heart sounds and intact  distal pulses.   Pulmonary/Chest: Effort normal and breath sounds normal.  Abdominal: Soft. Bowel sounds are normal. There is no splenomegaly or hepatomegaly.  Genitourinary: Rectum normal, prostate normal and penis normal.  Musculoskeletal: Normal range of motion.  Neurological: He is alert and oriented to person, place, and time. He has normal reflexes.  Skin: No rash noted. No erythema.  Psychiatric: He has a normal mood and affect. His behavior is normal. Judgment and thought content normal.    Results for orders placed or performed in visit on 10/03/16  Basic metabolic panel  Result Value Ref Range   Glucose 95 65 - 99 mg/dL   BUN 19 6 - 24 mg/dL   Creatinine, Ser 4.09 0.76 - 1.27 mg/dL   GFR calc non Af Amer 77 >59 mL/min/1.73   GFR calc Af Amer 89 >59 mL/min/1.73   BUN/Creatinine Ratio 17 9 - 20   Sodium 139 134 - 144 mmol/L   Potassium 5.1 3.5 - 5.2 mmol/L   Chloride 100 96 - 106 mmol/L   CO2 24 18 - 29 mmol/L   Calcium 9.9 8.7 - 10.2 mg/dL      Assessment & Plan:   Problem List Items Addressed This Visit      Cardiovascular and Mediastinum   Essential hypertension    Discuss hypertension poor control with weight gain. Discuss weight loss techniques exercise increase of activity. For now we  will increase amlodipine from 5 mg to 10 mg discuss edema as a possible side effect. Will recheck 1 month to reevaluate blood pressure and BMP.      Relevant Medications   amLODipine (NORVASC) 10 MG tablet   Other Relevant Orders   CBC with Differential/Platelet    Other Visit Diagnoses    Annual physical exam    -  Primary   Screening for diabetes mellitus (DM)       Relevant Orders   Comprehensive metabolic panel   Urinalysis, Routine w reflex microscopic   Screening cholesterol level       Relevant Orders   Lipid panel   Prostate cancer screening       Relevant Orders   PSA   Thyroid disorder screen       Relevant Orders   TSH       Follow up plan: Return in  about 4 weeks (around 06/03/2017) for BMP, check BP.

## 2017-05-06 NOTE — Assessment & Plan Note (Signed)
Discuss hypertension poor control with weight gain. Discuss weight loss techniques exercise increase of activity. For now we will increase amlodipine from 5 mg to 10 mg discuss edema as a possible side effect. Will recheck 1 month to reevaluate blood pressure and BMP.

## 2017-05-07 ENCOUNTER — Telehealth: Payer: Self-pay | Admitting: Family Medicine

## 2017-05-07 DIAGNOSIS — R7989 Other specified abnormal findings of blood chemistry: Secondary | ICD-10-CM

## 2017-05-07 LAB — COMPREHENSIVE METABOLIC PANEL
ALBUMIN: 4.7 g/dL (ref 3.5–5.5)
ALK PHOS: 73 IU/L (ref 39–117)
ALT: 54 IU/L — ABNORMAL HIGH (ref 0–44)
AST: 29 IU/L (ref 0–40)
Albumin/Globulin Ratio: 2.1 (ref 1.2–2.2)
BUN/Creatinine Ratio: 17 (ref 9–20)
BUN: 16 mg/dL (ref 6–24)
Bilirubin Total: 0.8 mg/dL (ref 0.0–1.2)
CO2: 23 mmol/L (ref 20–29)
CREATININE: 0.95 mg/dL (ref 0.76–1.27)
Calcium: 10.1 mg/dL (ref 8.7–10.2)
Chloride: 102 mmol/L (ref 96–106)
GFR calc Af Amer: 110 mL/min/{1.73_m2} (ref >59)
GFR calc non Af Amer: 95 mL/min/{1.73_m2} (ref >59)
GLUCOSE: 99 mg/dL (ref 65–99)
Globulin, Total: 2.2 g/dL (ref 1.5–4.5)
Potassium: 4.7 mmol/L (ref 3.5–5.2)
Sodium: 141 mmol/L (ref 134–144)
Total Protein: 6.9 g/dL (ref 6.0–8.5)

## 2017-05-07 LAB — LIPID PANEL
CHOLESTEROL TOTAL: 196 mg/dL (ref 100–199)
Chol/HDL Ratio: 6.8 ratio — ABNORMAL HIGH (ref 0.0–5.0)
HDL: 29 mg/dL — AB (ref >39)
LDL CALC: 116 mg/dL — AB (ref 0–99)
TRIGLYCERIDES: 256 mg/dL — AB (ref 0–149)
VLDL CHOLESTEROL CAL: 51 mg/dL — AB (ref 5–40)

## 2017-05-07 LAB — CBC WITH DIFFERENTIAL/PLATELET
BASOS ABS: 0 10*3/uL (ref 0.0–0.2)
Basos: 1 %
EOS (ABSOLUTE): 0.1 10*3/uL (ref 0.0–0.4)
Eos: 2 %
HEMOGLOBIN: 15.9 g/dL (ref 13.0–17.7)
Hematocrit: 48.7 % (ref 37.5–51.0)
Immature Grans (Abs): 0 10*3/uL (ref 0.0–0.1)
Immature Granulocytes: 1 %
LYMPHS ABS: 1.8 10*3/uL (ref 0.7–3.1)
Lymphs: 29 %
MCH: 28.4 pg (ref 26.6–33.0)
MCHC: 32.6 g/dL (ref 31.5–35.7)
MCV: 87 fL (ref 79–97)
MONOCYTES: 8 %
Monocytes Absolute: 0.5 10*3/uL (ref 0.1–0.9)
Neutrophils Absolute: 3.7 10*3/uL (ref 1.4–7.0)
Neutrophils: 59 %
Platelets: 281 10*3/uL (ref 150–379)
RBC: 5.6 x10E6/uL (ref 4.14–5.80)
RDW: 14.3 % (ref 12.3–15.4)
WBC: 6.1 10*3/uL (ref 3.4–10.8)

## 2017-05-07 LAB — TSH: TSH: 4.64 u[IU]/mL — ABNORMAL HIGH (ref 0.450–4.500)

## 2017-05-07 LAB — PSA: Prostate Specific Ag, Serum: 1.3 ng/mL (ref 0.0–4.0)

## 2017-05-07 NOTE — Telephone Encounter (Signed)
Phone call Discussed with patient slightly elevated TSH will repeat TSH in a couple of months

## 2017-05-08 LAB — URINALYSIS, ROUTINE W REFLEX MICROSCOPIC
BILIRUBIN UA: NEGATIVE
Glucose, UA: NEGATIVE
KETONES UA: NEGATIVE
Leukocytes, UA: NEGATIVE
NITRITE UA: NEGATIVE
Protein, UA: NEGATIVE
SPEC GRAV UA: 1.005 (ref 1.005–1.030)
UUROB: 0.2 mg/dL (ref 0.2–1.0)
pH, UA: 5.5 (ref 5.0–7.5)

## 2017-05-08 LAB — MICROSCOPIC EXAMINATION
Bacteria, UA: NONE SEEN
CASTS: NONE SEEN /LPF
Cast Type: NONE SEEN
Crystal Type: NONE SEEN
Crystals: NONE SEEN
EPITHELIAL CELLS (NON RENAL): NONE SEEN /HPF (ref 0–10)
MUCUS UA: NONE SEEN
RBC MICROSCOPIC, UA: NONE SEEN /HPF (ref 0–?)
Renal Epithel, UA: NONE SEEN /hpf
TRICHOMONAS UA: NONE SEEN
WBC UA: NONE SEEN /HPF (ref 0–?)
Yeast, UA: NONE SEEN

## 2017-05-26 ENCOUNTER — Telehealth: Payer: Self-pay | Admitting: Family Medicine

## 2017-05-26 DIAGNOSIS — I10 Essential (primary) hypertension: Secondary | ICD-10-CM

## 2017-05-26 MED ORDER — AMLODIPINE BESYLATE 5 MG PO TABS: 5.0000 mg | ORAL_TABLET | Freq: Every day | ORAL | 1 refills | Status: DC

## 2017-05-26 MED ORDER — TRIAMCINOLONE ACETONIDE 0.1 % EX CREA: 1.0000 "application " | TOPICAL_CREAM | Freq: Two times a day (BID) | CUTANEOUS | 0 refills | Status: DC

## 2017-05-26 NOTE — Telephone Encounter (Signed)
Pt's amlodipine was increased from 5 mg to 10 mg at last O.V.

## 2017-05-26 NOTE — Telephone Encounter (Signed)
Patient was transferred to provider for telephone conversation.   

## 2017-05-26 NOTE — Telephone Encounter (Signed)
Phone call Discussed with patient edema with increase amlodipine dose and also some rash and contact areas around waistband and We will decrease amlodipine to 5 mg give triamcinolone cream add hydrochlorothiazide 25 mg

## 2017-05-27 ENCOUNTER — Telehealth: Payer: Self-pay | Admitting: Family Medicine

## 2017-05-27 DIAGNOSIS — I10 Essential (primary) hypertension: Secondary | ICD-10-CM

## 2017-05-27 MED ORDER — AMLODIPINE BESYLATE 10 MG PO TABS: 10.0000 mg | ORAL_TABLET | Freq: Every day | ORAL | 2 refills | Status: DC

## 2017-05-27 NOTE — Telephone Encounter (Signed)
Spoke with pt yesterday about adding a new BP medication. (see prior telephone encounter) medication was not prescribed. Please send to Pharmacy.

## 2017-05-27 NOTE — Telephone Encounter (Signed)
Patient's pharmacy will not prescribe prescription due to it being a BP pill.   Please Advise.  Thank you

## 2017-06-05 ENCOUNTER — Encounter: Payer: Self-pay | Admitting: Family Medicine

## 2017-06-05 ENCOUNTER — Ambulatory Visit (INDEPENDENT_AMBULATORY_CARE_PROVIDER_SITE_OTHER): Payer: 59 | Admitting: Family Medicine

## 2017-06-05 VITALS — BP 136/88 | HR 72 | Wt 237.0 lb

## 2017-06-05 DIAGNOSIS — I10 Essential (primary) hypertension: Secondary | ICD-10-CM

## 2017-06-05 MED ORDER — AMLODIPINE BESYLATE 5 MG PO TABS: 5.0000 mg | ORAL_TABLET | Freq: Every day | ORAL | 2 refills | Status: DC

## 2017-06-05 MED ORDER — HYDROCHLOROTHIAZIDE 25 MG PO TABS: 25.0000 mg | ORAL_TABLET | Freq: Every day | ORAL | 2 refills | Status: DC

## 2017-06-05 NOTE — Progress Notes (Signed)
BP 136/88   Pulse 72   Wt 237 lb (107.5 kg)   SpO2 99%   BMI 33.05 kg/m    Subjective:    Patient ID: Isaiah Terrell, male    DOB: 04/18/1971, 46 y.o.   MRN: [id number]  HPI: Isaiah Terrell is a 46 y.o. male  Chief Complaint  Patient presents with  . Follow-up  Follow-up pressure patient through misadventures with the drugstore was unable to get amlodipine 5 mg and hydrochlorothiazide 25 as we discussed on the 13th due to edema side effects and poor blood pressure control. Patient did get triamcinolone which has just about resolved rash and high contact areas. Patient otherwise doing well maybe some developed a slight dry cough has not felt sick with this cough.  Relevant past medical, surgical, family and social history reviewed and updated as indicated. Interim medical history since our last visit reviewed. Allergies and medications reviewed and updated.  Review of Systems  Constitutional: Negative.   Respiratory: Negative.   Cardiovascular: Negative.     Per HPI unless specifically indicated above     Objective:    BP 136/88   Pulse 72   Wt 237 lb (107.5 kg)   SpO2 99%   BMI 33.05 kg/m   Wt Readings from Last 3 Encounters:  06/05/17 237 lb (107.5 kg)  05/06/17 237 lb (107.5 kg)  10/03/16 233 lb 3.2 oz (105.8 kg)    Physical Exam  Constitutional: He is oriented to person, place, and time. He appears well-developed and well-nourished.  HENT:  Head: Normocephalic and atraumatic.  Eyes: Conjunctivae and EOM are normal.  Neck: Normal range of motion.  Cardiovascular: Normal rate, regular rhythm and normal heart sounds.   Pulmonary/Chest: Effort normal and breath sounds normal.  Musculoskeletal: Normal range of motion.  Neurological: He is alert and oriented to person, place, and time.  Skin: No erythema.  Psychiatric: He has a normal mood and affect. His behavior is normal. Judgment and thought content normal.    Results for orders placed or performed in visit on  05/06/17  Microscopic Examination  Result Value Ref Range   WBC, UA None seen 0 - 5 /hpf   RBC, UA None seen 0 - 2 /hpf   Epithelial Cells (non renal) None seen 0 - 10 /hpf   Renal Epithel, UA None seen None seen /hpf   Casts None seen None seen /lpf   Cast Type None seen N/A   Crystals None seen N/A   Crystal Type None seen N/A   Mucus, UA None seen Not Estab.   Bacteria, UA None seen None seen/Few   Yeast, UA None seen None seen   Trichomonas, UA None seen None seen  CBC with Differential/Platelet  Result Value Ref Range   WBC 6.1 3.4 - 10.8 x10E3/uL   RBC 5.60 4.14 - 5.80 x10E6/uL   Hemoglobin 15.9 13.0 - 17.7 g/dL   Hematocrit 84.6 96.2 - 51.0 %   MCV 87 79 - 97 fL   MCH 28.4 26.6 - 33.0 pg   MCHC 32.6 31.5 - 35.7 g/dL   RDW 95.2 84.1 - 32.4 %   Platelets 281 150 - 379 x10E3/uL   Neutrophils 59 Not Estab. %   Lymphs 29 Not Estab. %   Monocytes 8 Not Estab. %   Eos 2 Not Estab. %   Basos 1 Not Estab. %   Neutrophils Absolute 3.7 1.4 - 7.0 x10E3/uL   Lymphocytes Absolute 1.8 0.7 - 3.1 x10E3/uL  Monocytes Absolute 0.5 0.1 - 0.9 x10E3/uL   EOS (ABSOLUTE) 0.1 0.0 - 0.4 x10E3/uL   Basophils Absolute 0.0 0.0 - 0.2 x10E3/uL   Immature Granulocytes 1 Not Estab. %   Immature Grans (Abs) 0.0 0.0 - 0.1 x10E3/uL  Comprehensive metabolic panel  Result Value Ref Range   Glucose 99 65 - 99 mg/dL   BUN 16 6 - 24 mg/dL   Creatinine, Ser 8.11 0.76 - 1.27 mg/dL   GFR calc non Af Amer 95 >59 mL/min/1.73   GFR calc Af Amer 110 >59 mL/min/1.73   BUN/Creatinine Ratio 17 9 - 20   Sodium 141 134 - 144 mmol/L   Potassium 4.7 3.5 - 5.2 mmol/L   Chloride 102 96 - 106 mmol/L   CO2 23 20 - 29 mmol/L   Calcium 10.1 8.7 - 10.2 mg/dL   Total Protein 6.9 6.0 - 8.5 g/dL   Albumin 4.7 3.5 - 5.5 g/dL   Globulin, Total 2.2 1.5 - 4.5 g/dL   Albumin/Globulin Ratio 2.1 1.2 - 2.2   Bilirubin Total 0.8 0.0 - 1.2 mg/dL   Alkaline Phosphatase 73 39 - 117 IU/L   AST 29 0 - 40 IU/L   ALT 54 (H) 0 - 44  IU/L  Lipid panel  Result Value Ref Range   Cholesterol, Total 196 100 - 199 mg/dL   Triglycerides 914 (H) 0 - 149 mg/dL   HDL 29 (L) >78 mg/dL   VLDL Cholesterol Cal 51 (H) 5 - 40 mg/dL   LDL Calculated 295 (H) 0 - 99 mg/dL   Chol/HDL Ratio 6.8 (H) 0.0 - 5.0 ratio  PSA  Result Value Ref Range   Prostate Specific Ag, Serum 1.3 0.0 - 4.0 ng/mL  TSH  Result Value Ref Range   TSH 4.640 (H) 0.450 - 4.500 uIU/mL  Urinalysis, Routine w reflex microscopic  Result Value Ref Range   Specific Gravity, UA 1.005 1.005 - 1.030   pH, UA 5.5 5.0 - 7.5   Color, UA Yellow Yellow   Appearance Ur Clear Clear   Leukocytes, UA Negative Negative   Protein, UA Negative Negative/Trace   Glucose, UA Negative Negative   Ketones, UA Negative Negative   RBC, UA Trace (A) Negative   Bilirubin, UA Negative Negative   Urobilinogen, Ur 0.2 0.2 - 1.0 mg/dL   Nitrite, UA Negative Negative   Microscopic Examination See below:       Assessment & Plan:   Problem List Items Addressed This Visit      Cardiovascular and Mediastinum   Essential hypertension - Primary    Poor control with side effects to medications and difficulty getting prescriptions filled. We will decrease amlodipine from 10 mg which is causing leg edema 25 mg. We will attempt better control with hydrochlorothiazide 25 mg 1 a day We will need to recheck 1 month and check renal function of the time with BMP.      Relevant Medications   amLODipine (NORVASC) 5 MG tablet   hydrochlorothiazide (HYDRODIURIL) 25 MG tablet       Follow up plan: Return in about 4 weeks (around 07/03/2017) for BMP.

## 2017-06-05 NOTE — Assessment & Plan Note (Signed)
Poor control with side effects to medications and difficulty getting prescriptions filled. We will decrease amlodipine from 10 mg which is causing leg edema 25 mg. We will attempt better control with hydrochlorothiazide 25 mg 1 a day We will need to recheck 1 month and check renal function of the time with BMP.

## 2017-06-26 ENCOUNTER — Encounter: Payer: Self-pay | Admitting: Family Medicine

## 2017-06-26 ENCOUNTER — Ambulatory Visit (INDEPENDENT_AMBULATORY_CARE_PROVIDER_SITE_OTHER): Payer: 59 | Admitting: Family Medicine

## 2017-06-26 VITALS — BP 128/86 | HR 78 | Wt 237.0 lb

## 2017-06-26 DIAGNOSIS — R946 Abnormal results of thyroid function studies: Secondary | ICD-10-CM

## 2017-06-26 DIAGNOSIS — I10 Essential (primary) hypertension: Secondary | ICD-10-CM | POA: Diagnosis not present

## 2017-06-26 DIAGNOSIS — R7989 Other specified abnormal findings of blood chemistry: Secondary | ICD-10-CM

## 2017-06-26 DIAGNOSIS — R600 Localized edema: Secondary | ICD-10-CM

## 2017-06-26 DIAGNOSIS — R609 Edema, unspecified: Secondary | ICD-10-CM | POA: Insufficient documentation

## 2017-06-26 MED ORDER — AMLODIPINE BESYLATE 5 MG PO TABS: 5.0000 mg | ORAL_TABLET | Freq: Every day | ORAL | 3 refills | Status: DC

## 2017-06-26 MED ORDER — HYDROCHLOROTHIAZIDE 25 MG PO TABS: 25.0000 mg | ORAL_TABLET | Freq: Every day | ORAL | 3 refills | Status: DC

## 2017-06-26 NOTE — Assessment & Plan Note (Signed)
The current medical regimen is effective;  continue present plan and medications.  

## 2017-06-26 NOTE — Assessment & Plan Note (Signed)
Leg edema side effects from amlodipine has resolved with decrease dosing.

## 2017-06-26 NOTE — Progress Notes (Signed)
BP 128/86 (BP Location: Left Arm)   Pulse 78   Wt 237 lb (107.5 kg)   SpO2 98%   BMI 33.05 kg/m    Subjective:    Patient ID: Isaiah Terrell, male    DOB: 02/23/1971, 46 y.o.   MRN: [id number]  HPI: Isaiah Terrell is a 46 y.o. male  Chief Complaint  Patient presents with  . Hypertension   Patient recheck hypertension taking amlodipine 5 which is a reduced dose from 10 mg due to edema and edema has resolved and his legs. Blood pressure control with starting hydrochlorothiazide 25 mg taking that with no issues. Checking blood pressure at home with good control. No side effects from medications. Relevant past medical, surgical, family and social history reviewed and updated as indicated. Interim medical history since our last visit reviewed. Allergies and medications reviewed and updated.  Review of Systems  Constitutional: Negative.   Respiratory: Negative.   Cardiovascular: Negative.     Per HPI unless specifically indicated above     Objective:    BP 128/86 (BP Location: Left Arm)   Pulse 78   Wt 237 lb (107.5 kg)   SpO2 98%   BMI 33.05 kg/m   Wt Readings from Last 3 Encounters:  06/26/17 237 lb (107.5 kg)  06/05/17 237 lb (107.5 kg)  05/06/17 237 lb (107.5 kg)    Physical Exam  Constitutional: He is oriented to person, place, and time. He appears well-developed and well-nourished.  HENT:  Head: Normocephalic and atraumatic.  Eyes: Conjunctivae and EOM are normal.  Neck: Normal range of motion.  Cardiovascular: Normal rate, regular rhythm and normal heart sounds.   Pulmonary/Chest: Effort normal and breath sounds normal.  Musculoskeletal: Normal range of motion.  Neurological: He is alert and oriented to person, place, and time.  Skin: No erythema.  Psychiatric: He has a normal mood and affect. His behavior is normal. Judgment and thought content normal.    Results for orders placed or performed in visit on 05/06/17  Microscopic Examination  Result Value Ref  Range   WBC, UA None seen 0 - 5 /hpf   RBC, UA None seen 0 - 2 /hpf   Epithelial Cells (non renal) None seen 0 - 10 /hpf   Renal Epithel, UA None seen None seen /hpf   Casts None seen None seen /lpf   Cast Type None seen N/A   Crystals None seen N/A   Crystal Type None seen N/A   Mucus, UA None seen Not Estab.   Bacteria, UA None seen None seen/Few   Yeast, UA None seen None seen   Trichomonas, UA None seen None seen  CBC with Differential/Platelet  Result Value Ref Range   WBC 6.1 3.4 - 10.8 x10E3/uL   RBC 5.60 4.14 - 5.80 x10E6/uL   Hemoglobin 15.9 13.0 - 17.7 g/dL   Hematocrit 40.948.7 81.137.5 - 51.0 %   MCV 87 79 - 97 fL   MCH 28.4 26.6 - 33.0 pg   MCHC 32.6 31.5 - 35.7 g/dL   RDW 91.414.3 78.212.3 - 95.615.4 %   Platelets 281 150 - 379 x10E3/uL   Neutrophils 59 Not Estab. %   Lymphs 29 Not Estab. %   Monocytes 8 Not Estab. %   Eos 2 Not Estab. %   Basos 1 Not Estab. %   Neutrophils Absolute 3.7 1.4 - 7.0 x10E3/uL   Lymphocytes Absolute 1.8 0.7 - 3.1 x10E3/uL   Monocytes Absolute 0.5 0.1 - 0.9 x10E3/uL  EOS (ABSOLUTE) 0.1 0.0 - 0.4 x10E3/uL   Basophils Absolute 0.0 0.0 - 0.2 x10E3/uL   Immature Granulocytes 1 Not Estab. %   Immature Grans (Abs) 0.0 0.0 - 0.1 x10E3/uL  Comprehensive metabolic panel  Result Value Ref Range   Glucose 99 65 - 99 mg/dL   BUN 16 6 - 24 mg/dL   Creatinine, Ser 1.61 0.76 - 1.27 mg/dL   GFR calc non Af Amer 95 >59 mL/min/1.73   GFR calc Af Amer 110 >59 mL/min/1.73   BUN/Creatinine Ratio 17 9 - 20   Sodium 141 134 - 144 mmol/L   Potassium 4.7 3.5 - 5.2 mmol/L   Chloride 102 96 - 106 mmol/L   CO2 23 20 - 29 mmol/L   Calcium 10.1 8.7 - 10.2 mg/dL   Total Protein 6.9 6.0 - 8.5 g/dL   Albumin 4.7 3.5 - 5.5 g/dL   Globulin, Total 2.2 1.5 - 4.5 g/dL   Albumin/Globulin Ratio 2.1 1.2 - 2.2   Bilirubin Total 0.8 0.0 - 1.2 mg/dL   Alkaline Phosphatase 73 39 - 117 IU/L   AST 29 0 - 40 IU/L   ALT 54 (H) 0 - 44 IU/L  Lipid panel  Result Value Ref Range    Cholesterol, Total 196 100 - 199 mg/dL   Triglycerides 096 (H) 0 - 149 mg/dL   HDL 29 (L) >04 mg/dL   VLDL Cholesterol Cal 51 (H) 5 - 40 mg/dL   LDL Calculated 540 (H) 0 - 99 mg/dL   Chol/HDL Ratio 6.8 (H) 0.0 - 5.0 ratio  PSA  Result Value Ref Range   Prostate Specific Ag, Serum 1.3 0.0 - 4.0 ng/mL  TSH  Result Value Ref Range   TSH 4.640 (H) 0.450 - 4.500 uIU/mL  Urinalysis, Routine w reflex microscopic  Result Value Ref Range   Specific Gravity, UA 1.005 1.005 - 1.030   pH, UA 5.5 5.0 - 7.5   Color, UA Yellow Yellow   Appearance Ur Clear Clear   Leukocytes, UA Negative Negative   Protein, UA Negative Negative/Trace   Glucose, UA Negative Negative   Ketones, UA Negative Negative   RBC, UA Trace (A) Negative   Bilirubin, UA Negative Negative   Urobilinogen, Ur 0.2 0.2 - 1.0 mg/dL   Nitrite, UA Negative Negative   Microscopic Examination See below:       Assessment & Plan:   Problem List Items Addressed This Visit      Cardiovascular and Mediastinum   Essential hypertension - Primary    The current medical regimen is effective;  continue present plan and medications.       Relevant Medications   amLODipine (NORVASC) 5 MG tablet   hydrochlorothiazide (HYDRODIURIL) 25 MG tablet   Other Relevant Orders   Basic metabolic panel     Other   Edema    Leg edema side effects from amlodipine has resolved with decrease dosing.       Other Visit Diagnoses    Elevated TSH         Will recheck TSH today because of elevated TSH last blood draw  Follow up plan: Return in about 6 months (around 12/24/2017) for BMP.

## 2017-06-27 ENCOUNTER — Encounter: Payer: Self-pay | Admitting: Family Medicine

## 2017-06-27 LAB — BASIC METABOLIC PANEL
BUN/Creatinine Ratio: 22 — ABNORMAL HIGH (ref 9–20)
BUN: 22 mg/dL (ref 6–24)
CALCIUM: 10 mg/dL (ref 8.7–10.2)
CO2: 23 mmol/L (ref 20–29)
CREATININE: 1 mg/dL (ref 0.76–1.27)
Chloride: 100 mmol/L (ref 96–106)
GFR calc Af Amer: 103 mL/min/{1.73_m2} (ref >59)
GFR, EST NON AFRICAN AMERICAN: 89 mL/min/{1.73_m2} (ref >59)
Glucose: 90 mg/dL (ref 65–99)
Potassium: 4.6 mmol/L (ref 3.5–5.2)
Sodium: 140 mmol/L (ref 134–144)

## 2017-06-27 LAB — TSH: TSH: 2.24 u[IU]/mL (ref 0.450–4.500)

## 2017-08-21 ENCOUNTER — Ambulatory Visit (INDEPENDENT_AMBULATORY_CARE_PROVIDER_SITE_OTHER): Payer: 59 | Admitting: Family Medicine

## 2017-08-21 ENCOUNTER — Encounter: Payer: Self-pay | Admitting: Family Medicine

## 2017-08-21 VITALS — BP 132/82 | HR 85 | Temp 97.8°F | Ht 72.0 in | Wt 239.7 lb

## 2017-08-21 DIAGNOSIS — L237 Allergic contact dermatitis due to plants, except food: Secondary | ICD-10-CM

## 2017-08-21 MED ORDER — HYDROXYZINE HCL 25 MG PO TABS: 25.0000 mg | ORAL_TABLET | Freq: Three times a day (TID) | ORAL | 0 refills | Status: DC | PRN

## 2017-08-21 MED ORDER — PREDNISONE 10 MG PO TABS: ORAL_TABLET | ORAL | 0 refills | Status: DC

## 2017-08-21 MED ORDER — CLOBETASOL PROPIONATE 0.05 % EX OINT: 1.0000 "application " | TOPICAL_OINTMENT | Freq: Two times a day (BID) | CUTANEOUS | 0 refills | Status: DC

## 2017-08-21 NOTE — Progress Notes (Signed)
   BP 132/82 (BP Location: Right Arm, Patient Position: Sitting, Cuff Size: Normal)   Pulse 85   Temp 97.8 F (36.6 C) (Temporal)   Ht 6' (1.829 m)   Wt 239 lb 11.2 oz (108.7 kg)   SpO2 96%   BMI 32.51 kg/m    Subjective:    Patient ID: Isaiah Terrell, male    DOB: 06/16/1971, 46 y.o.   MRN: [id number]  HPI: Isaiah Terrell is a 46 y.o. male  Chief Complaint  Patient presents with  . Poison Ivy    Started Monday. On chest, back, waist, nose, arms. Taking benadryl no relief.    Patient here with severely pruritic poison ivy rash that came up after he was chopping firewood outside 4 days ago. Started on nose and chest, now spreading toward arms, back and abdomen. Using oatmeal and allergy pills with no relief. Denies SOB, burning pain, fever.    Relevant past medical, surgical, family and social history reviewed and updated as indicated. Interim medical history since our last visit reviewed. Allergies and medications reviewed and updated.  Review of Systems  Constitutional: Negative.   Respiratory: Negative.   Cardiovascular: Negative.   Musculoskeletal: Negative.   Skin: Positive for rash.  Neurological: Negative.   Psychiatric/Behavioral: Negative.    Per HPI unless specifically indicated above     Objective:    BP 132/82 (BP Location: Right Arm, Patient Position: Sitting, Cuff Size: Normal)   Pulse 85   Temp 97.8 F (36.6 C) (Temporal)   Ht 6' (1.829 m)   Wt 239 lb 11.2 oz (108.7 kg)   SpO2 96%   BMI 32.51 kg/m   Wt Readings from Last 3 Encounters:  08/21/17 239 lb 11.2 oz (108.7 kg)  06/26/17 237 lb (107.5 kg)  06/05/17 237 lb (107.5 kg)    Physical Exam  Constitutional: He is oriented to person, place, and time. He appears well-developed and well-nourished. No distress.  HENT:  Head: Atraumatic.  Eyes: Conjunctivae are normal. Pupils are equal, round, and reactive to light.  Neck: Normal range of motion. Neck supple.  Cardiovascular: Normal rate and normal heart  sounds.  Pulmonary/Chest: Effort normal and breath sounds normal. No respiratory distress.  Musculoskeletal: Normal range of motion.  Neurological: He is alert and oriented to person, place, and time.  Skin: Skin is warm and dry. Rash (maculopapular erythematous rash with vesicles intermittently across mentioned areas. Some scabbing noted from scratching) noted.  Psychiatric: He has a normal mood and affect. His behavior is normal.  Nursing note and vitals reviewed.      Assessment & Plan:   Problem List Items Addressed This Visit    None    Visit Diagnoses    Poison ivy dermatitis    -  Primary   Will start extended prednisone taper and clobetasol prn. Can take hydroxyzine as needed, particularly for nighttime to help him sleep. F/u as needed       Follow up plan: Return for as scheduled.

## 2017-08-24 NOTE — Patient Instructions (Signed)
Follow up as scheduled.  

## 2017-12-31 ENCOUNTER — Ambulatory Visit (INDEPENDENT_AMBULATORY_CARE_PROVIDER_SITE_OTHER): Payer: 59 | Admitting: Family Medicine

## 2017-12-31 ENCOUNTER — Encounter: Payer: Self-pay | Admitting: Family Medicine

## 2017-12-31 VITALS — BP 149/106 | HR 87 | Wt 240.0 lb

## 2017-12-31 DIAGNOSIS — I10 Essential (primary) hypertension: Secondary | ICD-10-CM

## 2017-12-31 MED ORDER — METOPROLOL SUCCINATE ER 25 MG PO TB24
25.0000 mg | ORAL_TABLET | Freq: Every day | ORAL | 3 refills | Status: DC
Start: 2017-12-31 — End: 2018-01-30

## 2017-12-31 NOTE — Assessment & Plan Note (Addendum)
Discuss hypertension poor control most likely due to lifestyle factors.  With patient's age will be sensitive to elevation and not increased to amlodipine 10 mg because of edema issues in the past will add metoprolol 25 mg 1 a day. Recheck blood pressure 1 month or so. Discussed lifestyle weight loss salt reduction control.

## 2017-12-31 NOTE — Progress Notes (Addendum)
BP (!) 149/106   Pulse 87   Wt 240 lb (108.9 kg)   SpO2 95%   BMI 32.55 kg/m    Subjective:    Patient ID: Isaiah Terrell, male    DOB: 1971/03/22, 47 y.o.   MRN: [id number]  HPI: Isaiah Terrell is a 47 y.o. male  Chief Complaint  Patient presents with  . Hypertension   Patient discuss hypertension has had a very stressful year with daughter get married but that all went well.  Patient's blood pressures been up as long with his weight.  Patient's been taking hydrochlorothiazide 25 mg and amlodipine 5 mg faithfully without problems. In the past of tried to titrate amlodipine to 10 mg and developed edema.  Back on 5 mg does well. Discussed dermatitis which is resolved no further itching or issues has stopped medication as prescribed. Relevant past medical, surgical, family and social history reviewed and updated as indicated. Interim medical history since our last visit reviewed. Allergies and medications reviewed and updated.  Review of Systems  Constitutional: Negative.   Respiratory: Negative.   Cardiovascular: Negative.     Per HPI unless specifically indicated above     Objective:    BP (!) 149/106   Pulse 87   Wt 240 lb (108.9 kg)   SpO2 95%   BMI 32.55 kg/m   Wt Readings from Last 3 Encounters:  12/31/17 240 lb (108.9 kg)  08/21/17 239 lb 11.2 oz (108.7 kg)  06/26/17 237 lb (107.5 kg)    Physical Exam  Constitutional: He is oriented to person, place, and time. He appears well-developed and well-nourished.  HENT:  Head: Normocephalic and atraumatic.  Eyes: Conjunctivae and EOM are normal.  Neck: Normal range of motion.  Cardiovascular: Normal rate, regular rhythm and normal heart sounds.  Pulmonary/Chest: Effort normal and breath sounds normal.  Musculoskeletal: Normal range of motion.  Neurological: He is alert and oriented to person, place, and time.  Skin: No erythema.  Psychiatric: He has a normal mood and affect. His behavior is normal. Judgment and  thought content normal.    Results for orders placed or performed in visit on 06/26/17  Basic metabolic panel  Result Value Ref Range   Glucose 90 65 - 99 mg/dL   BUN 22 6 - 24 mg/dL   Creatinine, Ser 4.09 0.76 - 1.27 mg/dL   GFR calc non Af Amer 89 >59 mL/min/1.73   GFR calc Af Amer 103 >59 mL/min/1.73   BUN/Creatinine Ratio 22 (H) 9 - 20   Sodium 140 134 - 144 mmol/L   Potassium 4.6 3.5 - 5.2 mmol/L   Chloride 100 96 - 106 mmol/L   CO2 23 20 - 29 mmol/L   Calcium 10.0 8.7 - 10.2 mg/dL  TSH  Result Value Ref Range   TSH 2.240 0.450 - 4.500 uIU/mL      Assessment & Plan:   Problem List Items Addressed This Visit      Cardiovascular and Mediastinum   Essential hypertension - Primary    Discuss hypertension poor control most likely due to lifestyle factors.  With patient's age will be sensitive to elevation and not increased to amlodipine 10 mg because of edema issues in the past will add metoprolol 25 mg 1 a day. Recheck blood pressure 1 month or so. Discussed lifestyle weight loss salt reduction control.      Relevant Medications   metoprolol succinate (TOPROL-XL) 25 MG 24 hr tablet   Other Relevant Orders   Basic  metabolic panel    Discussed dermatitis and resolution patient not taking medications were removed from chart.  Follow up plan: Return in about 4 weeks (around 01/28/2018) for BP recheck.

## 2018-01-01 ENCOUNTER — Encounter: Payer: Self-pay | Admitting: Family Medicine

## 2018-01-01 LAB — BASIC METABOLIC PANEL
BUN/Creatinine Ratio: 21 — ABNORMAL HIGH (ref 9–20)
BUN: 18 mg/dL (ref 6–24)
CALCIUM: 10.1 mg/dL (ref 8.7–10.2)
CO2: 24 mmol/L (ref 20–29)
CREATININE: 0.86 mg/dL (ref 0.76–1.27)
Chloride: 102 mmol/L (ref 96–106)
GFR calc Af Amer: 119 mL/min/{1.73_m2} (ref >59)
GFR, EST NON AFRICAN AMERICAN: 103 mL/min/{1.73_m2} (ref >59)
Glucose: 97 mg/dL (ref 65–99)
POTASSIUM: 4.8 mmol/L (ref 3.5–5.2)
Sodium: 141 mmol/L (ref 134–144)

## 2018-01-30 ENCOUNTER — Encounter: Payer: Self-pay | Admitting: Family Medicine

## 2018-01-30 ENCOUNTER — Ambulatory Visit (INDEPENDENT_AMBULATORY_CARE_PROVIDER_SITE_OTHER): Payer: 59 | Admitting: Family Medicine

## 2018-01-30 VITALS — BP 142/96 | HR 85 | Ht 72.0 in | Wt 236.0 lb

## 2018-01-30 DIAGNOSIS — N529 Male erectile dysfunction, unspecified: Secondary | ICD-10-CM

## 2018-01-30 DIAGNOSIS — I10 Essential (primary) hypertension: Secondary | ICD-10-CM | POA: Diagnosis not present

## 2018-01-30 MED ORDER — SILDENAFIL CITRATE 20 MG PO TABS: ORAL_TABLET | ORAL | 0 refills | Status: DC

## 2018-01-30 MED ORDER — LOSARTAN POTASSIUM 100 MG PO TABS: 100.0000 mg | ORAL_TABLET | Freq: Every day | ORAL | 0 refills | Status: DC

## 2018-01-30 NOTE — Progress Notes (Signed)
BP (!) 142/96   Pulse 85   Ht 6' (1.829 m)   Wt 236 lb (107 kg)   SpO2 100%   BMI 32.01 kg/m    Subjective:    Patient ID: Isaiah Terrell, male    DOB: 01/27/1971, 47 y.o.   MRN: [id number]  HPI: Isaiah Terrell is a 47 y.o. male  Chief Complaint  Patient presents with  . Follow-up  . Hypertension   Pt here today for 1 month BP f/u after addition of metoprolol. States home BPs have minimally changed since addition, still 140s-160s/90s-low 100s. Noticing libido issues as well as erectile issues since starting the metoprolol but no dizziness, fatigue, lightheadedness. Denies CP, SOB, visual changes.   Relevant past medical, surgical, family and social history reviewed and updated as indicated. Interim medical history since our last visit reviewed. Allergies and medications reviewed and updated.  Review of Systems  Per HPI unless specifically indicated above     Objective:    BP (!) 142/96   Pulse 85   Ht 6' (1.829 m)   Wt 236 lb (107 kg)   SpO2 100%   BMI 32.01 kg/m   Wt Readings from Last 3 Encounters:  01/30/18 236 lb (107 kg)  12/31/17 240 lb (108.9 kg)  08/21/17 239 lb 11.2 oz (108.7 kg)    Physical Exam  Constitutional: He is oriented to person, place, and time. He appears well-developed and well-nourished. No distress.  HENT:  Head: Atraumatic.  Eyes: Pupils are equal, round, and reactive to light. Conjunctivae are normal.  Neck: Normal range of motion. Neck supple.  Cardiovascular: Normal rate, regular rhythm and normal heart sounds.  Pulmonary/Chest: Effort normal and breath sounds normal.  Musculoskeletal: Normal range of motion.  Neurological: He is alert and oriented to person, place, and time.  Skin: Skin is warm and dry.  Psychiatric: He has a normal mood and affect. His behavior is normal.  Nursing note and vitals reviewed.   Results for orders placed or performed in visit on 12/31/17  Basic metabolic panel  Result Value Ref Range   Glucose 97 65 -  99 mg/dL   BUN 18 6 - 24 mg/dL   Creatinine, Ser 7.820.86 0.76 - 1.27 mg/dL   GFR calc non Af Amer 103 >59 mL/min/1.73   GFR calc Af Amer 119 >59 mL/min/1.73   BUN/Creatinine Ratio 21 (H) 9 - 20   Sodium 141 134 - 144 mmol/L   Potassium 4.8 3.5 - 5.2 mmol/L   Chloride 102 96 - 106 mmol/L   CO2 24 20 - 29 mmol/L   Calcium 10.1 8.7 - 10.2 mg/dL      Assessment & Plan:   Problem List Items Addressed This Visit      Cardiovascular and Mediastinum   Essential hypertension - Primary    Will d/c metoprolol given sexual side effects. Start losartan 100 mg. Check home BPs and follow up with persistent abnormals. Continue amlodipine and HCTZ. DASH diet handout given, aerobic exercise recommendations reviewed      Relevant Medications   losartan (COZAAR) 100 MG tablet   sildenafil (REVATIO) 20 MG tablet    Other Visit Diagnoses    Erectile dysfunction, unspecified erectile dysfunction type       Hoping d/c of metoprolol improves this, but will trial sildenafil additionally for as needed use. Risks and benefits reviewed       Follow up plan: Return in about 1 month (around 02/27/2018) for BP f/u.

## 2018-01-30 NOTE — Assessment & Plan Note (Addendum)
Will d/c metoprolol given sexual side effects. Start losartan 100 mg. Check home BPs and follow up with persistent abnormals. Continue amlodipine and HCTZ. DASH diet handout given, aerobic exercise recommendations reviewed

## 2018-01-30 NOTE — Patient Instructions (Addendum)
Watch out for heart rates under 60 that ARE CAUSING SYMPTOMS (ie: lightheadedness, fainting, sweating, fatigue)    DASH Eating Plan DASH stands for "Dietary Approaches to Stop Hypertension." The DASH eating plan is a healthy eating plan that has been shown to reduce high blood pressure (hypertension). It may also reduce your risk for type 2 diabetes, heart disease, and stroke. The DASH eating plan may also help with weight loss. What are tips for following this plan? General guidelines  Avoid eating more than 2,300 mg (milligrams) of salt (sodium) a day. If you have hypertension, you may need to reduce your sodium intake to 1,500 mg a day.  Limit alcohol intake to no more than 1 drink a day for nonpregnant women and 2 drinks a day for men. One drink equals 12 oz of beer, 5 oz of wine, or 1 oz of hard liquor.  Work with your health care provider to maintain a healthy body weight or to lose weight. Ask what an ideal weight is for you.  Get at least 30 minutes of exercise that causes your heart to beat faster (aerobic exercise) most days of the week. Activities may include walking, swimming, or biking.  Work with your health care provider or diet and nutrition specialist (dietitian) to adjust your eating plan to your individual calorie needs. Reading food labels  Check food labels for the amount of sodium per serving. Choose foods with less than 5 percent of the Daily Value of sodium. Generally, foods with less than 300 mg of sodium per serving fit into this eating plan.  To find whole grains, look for the word "whole" as the first word in the ingredient list. Shopping  Buy products labeled as "low-sodium" or "no salt added."  Buy fresh foods. Avoid canned foods and premade or frozen meals. Cooking  Avoid adding salt when cooking. Use salt-free seasonings or herbs instead of table salt or sea salt. Check with your health care provider or pharmacist before using salt substitutes.  Do not  fry foods. Cook foods using healthy methods such as baking, boiling, grilling, and broiling instead.  Cook with heart-healthy oils, such as olive, canola, soybean, or sunflower oil. Meal planning   Eat a balanced diet that includes: ? 5 or more servings of fruits and vegetables each day. At each meal, try to fill half of your plate with fruits and vegetables. ? Up to 6-8 servings of whole grains each day. ? Less than 6 oz of lean meat, poultry, or fish each day. A 3-oz serving of meat is about the same size as a deck of cards. One egg equals 1 oz. ? 2 servings of low-fat dairy each day. ? A serving of nuts, seeds, or beans 5 times each week. ? Heart-healthy fats. Healthy fats called Omega-3 fatty acids are found in foods such as flaxseeds and coldwater fish, like sardines, salmon, and mackerel.  Limit how much you eat of the following: ? Canned or prepackaged foods. ? Food that is high in trans fat, such as fried foods. ? Food that is high in saturated fat, such as fatty meat. ? Sweets, desserts, sugary drinks, and other foods with added sugar. ? Full-fat dairy products.  Do not salt foods before eating.  Try to eat at least 2 vegetarian meals each week.  Eat more home-cooked food and less restaurant, buffet, and fast food.  When eating at a restaurant, ask that your food be prepared with less salt or no salt, if possible.  What foods are recommended? The items listed may not be a complete list. Talk with your dietitian about what dietary choices are best for you. Grains Whole-grain or whole-wheat bread. Whole-grain or whole-wheat pasta. Brown rice. [place]. Bulgur. Whole-grain and low-sodium cereals. Pita bread. Low-fat, low-sodium crackers. Whole-wheat flour tortillas. Vegetables Fresh or frozen vegetables (raw, steamed, roasted, or grilled). Low-sodium or reduced-sodium tomato and vegetable juice. Low-sodium or reduced-sodium tomato sauce and tomato paste. Low-sodium or  reduced-sodium canned vegetables. Fruits All fresh, dried, or frozen fruit. Canned fruit in natural juice (without added sugar). Meat and other protein foods Skinless chicken or [place]. Ground chicken or [place]. Pork with fat trimmed off. Fish and seafood. Egg whites. Dried beans, peas, or lentils. Unsalted nuts, nut butters, and seeds. Unsalted canned beans. Lean cuts of beef with fat trimmed off. Low-sodium, lean deli meat. Dairy Low-fat (1%) or fat-free (skim) milk. Fat-free, low-fat, or reduced-fat cheeses. Nonfat, low-sodium ricotta or cottage cheese. Low-fat or nonfat yogurt. Low-fat, low-sodium cheese. Fats and oils Soft margarine without trans fats. Vegetable oil. Low-fat, reduced-fat, or light mayonnaise and salad dressings (reduced-sodium). Canola, safflower, olive, soybean, and sunflower oils. Avocado. Seasoning and other foods Herbs. Spices. Seasoning mixes without salt. Unsalted popcorn and pretzels. Fat-free sweets. What foods are not recommended? The items listed may not be a complete list. Talk with your dietitian about what dietary choices are best for you. Grains Baked goods made with fat, such as croissants, muffins, or some breads. Dry pasta or rice meal packs. Vegetables Creamed or fried vegetables. Vegetables in a cheese sauce. Regular canned vegetables (not low-sodium or reduced-sodium). Regular canned tomato sauce and paste (not low-sodium or reduced-sodium). Regular tomato and vegetable juice (not low-sodium or reduced-sodium). Angie Fava. Olives. Fruits Canned fruit in a light or heavy syrup. Fried fruit. Fruit in cream or butter sauce. Meat and other protein foods Fatty cuts of meat. Ribs. Fried meat. Berniece Salines. Sausage. Bologna and other processed lunch meats. Salami. Fatback. Hotdogs. Bratwurst. Salted nuts and seeds. Canned beans with added salt. Canned or smoked fish. Whole eggs or egg yolks. Chicken or [place] with skin. Dairy Whole or 2% milk, cream, and half-and-half.  Whole or full-fat cream cheese. Whole-fat or sweetened yogurt. Full-fat cheese. Nondairy creamers. Whipped toppings. Processed cheese and cheese spreads. Fats and oils Butter. Stick margarine. Lard. Shortening. Ghee. Bacon fat. Tropical oils, such as coconut, palm kernel, or palm oil. Seasoning and other foods Salted popcorn and pretzels. Onion salt, garlic salt, seasoned salt, table salt, and sea salt. Worcestershire sauce. Tartar sauce. Barbecue sauce. Teriyaki sauce. Soy sauce, including reduced-sodium. Steak sauce. Canned and packaged gravies. Fish sauce. Oyster sauce. Cocktail sauce. Horseradish that you find on the shelf. Ketchup. Mustard. Meat flavorings and tenderizers. Bouillon cubes. Hot sauce and Tabasco sauce. Premade or packaged marinades. Premade or packaged taco seasonings. Relishes. Regular salad dressings. Where to find more information:  National Heart, Lung, and [place]: https://wilson-eaton.com/  American Heart Association: www.heart.org Summary  The DASH eating plan is a healthy eating plan that has been shown to reduce high blood pressure (hypertension). It may also reduce your risk for type 2 diabetes, heart disease, and stroke.  With the DASH eating plan, you should limit salt (sodium) intake to 2,300 mg a day. If you have hypertension, you may need to reduce your sodium intake to 1,500 mg a day.  When on the DASH eating plan, aim to eat more fresh fruits and vegetables, whole grains, lean proteins, low-fat dairy, and heart-healthy fats.  Work  with your health care provider or diet and nutrition specialist (dietitian) to adjust your eating plan to your individual calorie needs. This information is not intended to replace advice given to you by your health care provider. Make sure you discuss any questions you have with your health care provider. Document Released: 09/19/2011 Document Revised: 09/23/2016 Document Reviewed: 09/23/2016 Elsevier Interactive Patient Education   [employer]2018 Elsevier Inc.

## 2018-02-19 ENCOUNTER — Other Ambulatory Visit: Payer: Self-pay | Admitting: Family Medicine

## 2018-02-19 DIAGNOSIS — I10 Essential (primary) hypertension: Secondary | ICD-10-CM

## 2018-02-19 NOTE — Telephone Encounter (Signed)
Copied from CRM [id number]. Topic: Quick Communication - Rx Refill/Question >> Feb 19, 2018 10:27 AM Cipriano Bunker wrote: Medication:   Pt. Is out - needing asap  amLODipine (NORVASC) 5 MG tablet  Has the patient contacted their pharmacy? Yes.    (Agent: If no, request that the patient contact the pharmacy for the refill.) Preferred Pharmacy (with phone number or street name):   [facility] - [place], [place] - [address] [place] [address] Phone: [phone] Fax: [phone]   Agent: Please be advised that RX refills may take up to 3 business days. We ask that you follow-up with your pharmacy.

## 2018-02-20 NOTE — Telephone Encounter (Signed)
Years supply called in September- should not be due.

## 2018-02-20 NOTE — Telephone Encounter (Signed)
Last visit 01/30/18 w/ Fleet Contras

## 2018-02-23 NOTE — Telephone Encounter (Signed)
Spoke with pharmacy, they have medication on file.  He picked medication up already after he called.

## 2018-03-03 ENCOUNTER — Ambulatory Visit: Payer: 59 | Admitting: Family Medicine

## 2018-03-13 ENCOUNTER — Ambulatory Visit (INDEPENDENT_AMBULATORY_CARE_PROVIDER_SITE_OTHER): Payer: 59 | Admitting: Family Medicine

## 2018-03-13 ENCOUNTER — Encounter: Payer: Self-pay | Admitting: Family Medicine

## 2018-03-13 VITALS — BP 145/95 | HR 94 | Temp 98.5°F | Wt 226.5 lb

## 2018-03-13 DIAGNOSIS — I10 Essential (primary) hypertension: Secondary | ICD-10-CM | POA: Diagnosis not present

## 2018-03-13 MED ORDER — LOSARTAN POTASSIUM 100 MG PO TABS: 100.0000 mg | ORAL_TABLET | Freq: Every day | ORAL | 0 refills | Status: DC

## 2018-03-13 NOTE — Assessment & Plan Note (Signed)
Not doing great, but not terrible. Has been out of his losartan. Recheck and follow up at physical. Continue HCTZ and amlodipine.

## 2018-03-13 NOTE — Progress Notes (Signed)
BP (!) 145/95 (BP Location: Left Arm, Patient Position: Sitting, Cuff Size: Normal)   Pulse 94   Temp 98.5 F (36.9 C)   Wt 226 lb 8 oz (102.7 kg)   SpO2 97%   BMI 30.72 kg/m    Subjective:    Patient ID: Isaiah Terrell, male    DOB: [birth year], 47 y.o.   MRN: [id number]  HPI: Isaiah Terrell is a 47 y.o. male  Chief Complaint  Patient presents with  . Hypertension   HYPERTENSION- metoprolol was to be d/c'd due to sexual side effects and losartan was to be started. He states that he has been taking his HCTZ, his amlodipine and a "potassium 153m"- which I think is the losartan. He has been out of it for the past week or so Hypertension status: uncontrolled  Satisfied with current treatment? no Duration of hypertension: chronic BP monitoring frequency:  not checking BP medication side effects:  no Medication compliance: excellent compliance Previous BP meds:HCTZ, amlodpine, losartan Aspirin: no Recurrent headaches: no Visual changes: no Palpitations: no Dyspnea: no Chest pain: no Lower extremity edema: no Dizzy/lightheaded: no  Relevant past medical, surgical, family and social history reviewed and updated as indicated. Interim medical history since our last visit reviewed. Allergies and medications reviewed and updated.  Review of Systems  Constitutional: Negative.   Respiratory: Negative.   Cardiovascular: Negative.   Psychiatric/Behavioral: Negative.     Per HPI unless specifically indicated above     Objective:    BP (!) 145/95 (BP Location: Left Arm, Patient Position: Sitting, Cuff Size: Normal)   Pulse 94   Temp 98.5 F (36.9 C)   Wt 226 lb 8 oz (102.7 kg)   SpO2 97%   BMI 30.72 kg/m   Wt Readings from Last 3 Encounters:  03/13/18 226 lb 8 oz (102.7 kg)  01/30/18 236 lb (107 kg)  12/31/17 240 lb (108.9 kg)    Physical Exam  Constitutional: He is oriented to person, place, and time. He appears well-developed and well-nourished. No distress.  HENT:    Head: Normocephalic and atraumatic.  Right Ear: Hearing normal.  Left Ear: Hearing normal.  Nose: Nose normal.  Eyes: Conjunctivae and lids are normal. Right eye exhibits no discharge. Left eye exhibits no discharge. No scleral icterus.  Cardiovascular: Normal rate, regular rhythm, normal heart sounds and intact distal pulses. Exam reveals no gallop and no friction rub.  No murmur heard. Pulmonary/Chest: Effort normal and breath sounds normal. No stridor. No respiratory distress. He has no wheezes. He has no rales. He exhibits no tenderness.  Musculoskeletal: Normal range of motion.  Neurological: He is alert and oriented to person, place, and time.  Skin: Skin is warm, dry and intact. Capillary refill takes less than 2 seconds. No rash noted. He is not diaphoretic. No erythema. No pallor.  Psychiatric: He has a normal mood and affect. His speech is normal and behavior is normal. Judgment and thought content normal. Cognition and memory are normal.  Nursing note and vitals reviewed.   Results for orders placed or performed in visit on 037/04/88 Basic metabolic panel  Result Value Ref Range   Glucose 97 65 - 99 mg/dL   BUN 18 6 - 24 mg/dL   Creatinine, Ser 0.86 0.76 - 1.27 mg/dL   GFR calc non Af Amer 103 >59 mL/min/1.73   GFR calc Af Amer 119 >59 mL/min/1.73   BUN/Creatinine Ratio 21 (H) 9 - 20   Sodium 141 134 - 144 mmol/L  Potassium 4.8 3.5 - 5.2 mmol/L   Chloride 102 96 - 106 mmol/L   CO2 24 20 - 29 mmol/L   Calcium 10.1 8.7 - 10.2 mg/dL      Assessment & Plan:   Problem List Items Addressed This Visit      Cardiovascular and Mediastinum   Essential hypertension - Primary    Not doing great, but not terrible. Has been out of his losartan. Recheck and follow up at physical. Continue HCTZ and amlodipine.      Relevant Medications   losartan (COZAAR) 100 MG tablet   Other Relevant Orders   Basic metabolic panel       Follow up plan: Return As scheduled, for Physical  with MAC.

## 2018-03-14 LAB — BASIC METABOLIC PANEL
BUN/Creatinine Ratio: 22 — ABNORMAL HIGH (ref 9–20)
BUN: 20 mg/dL (ref 6–24)
CO2: 24 mmol/L (ref 20–29)
CREATININE: 0.93 mg/dL (ref 0.76–1.27)
Calcium: 10.1 mg/dL (ref 8.7–10.2)
Chloride: 100 mmol/L (ref 96–106)
GFR calc Af Amer: 112 mL/min/{1.73_m2} (ref >59)
GFR calc non Af Amer: 97 mL/min/{1.73_m2} (ref >59)
GLUCOSE: 98 mg/dL (ref 65–99)
Potassium: 3.5 mmol/L (ref 3.5–5.2)
SODIUM: 140 mmol/L (ref 134–144)

## 2018-05-05 ENCOUNTER — Ambulatory Visit (INDEPENDENT_AMBULATORY_CARE_PROVIDER_SITE_OTHER): Payer: 59 | Admitting: Family Medicine

## 2018-05-05 ENCOUNTER — Encounter: Payer: Self-pay | Admitting: Family Medicine

## 2018-05-05 VITALS — BP 134/88 | HR 68 | Ht 69.69 in | Wt 227.0 lb

## 2018-05-05 DIAGNOSIS — R7989 Other specified abnormal findings of blood chemistry: Secondary | ICD-10-CM

## 2018-05-05 DIAGNOSIS — Z0001 Encounter for general adult medical examination with abnormal findings: Secondary | ICD-10-CM | POA: Diagnosis not present

## 2018-05-05 DIAGNOSIS — Z1329 Encounter for screening for other suspected endocrine disorder: Secondary | ICD-10-CM

## 2018-05-05 DIAGNOSIS — Z131 Encounter for screening for diabetes mellitus: Secondary | ICD-10-CM

## 2018-05-05 DIAGNOSIS — I10 Essential (primary) hypertension: Secondary | ICD-10-CM

## 2018-05-05 DIAGNOSIS — L237 Allergic contact dermatitis due to plants, except food: Secondary | ICD-10-CM

## 2018-05-05 DIAGNOSIS — Z125 Encounter for screening for malignant neoplasm of prostate: Secondary | ICD-10-CM

## 2018-05-05 DIAGNOSIS — Z0000 Encounter for general adult medical examination without abnormal findings: Secondary | ICD-10-CM

## 2018-05-05 DIAGNOSIS — Z13220 Encounter for screening for lipoid disorders: Secondary | ICD-10-CM

## 2018-05-05 LAB — URINALYSIS, ROUTINE W REFLEX MICROSCOPIC
Bilirubin, UA: NEGATIVE
Glucose, UA: NEGATIVE
KETONES UA: NEGATIVE
LEUKOCYTES UA: NEGATIVE
NITRITE UA: NEGATIVE
PH UA: 5.5 (ref 5.0–7.5)
Protein, UA: NEGATIVE
Specific Gravity, UA: <1.005 — ABNORMAL LOW (ref 1.005–1.030)
Urobilinogen, Ur: 0.2 mg/dL (ref 0.2–1.0)

## 2018-05-05 LAB — MICROSCOPIC EXAMINATION: BACTERIA UA: NONE SEEN

## 2018-05-05 MED ORDER — LOSARTAN POTASSIUM 100 MG PO TABS: 100.0000 mg | ORAL_TABLET | Freq: Every day | ORAL | 4 refills | Status: DC

## 2018-05-05 MED ORDER — AMLODIPINE BESYLATE 5 MG PO TABS: 5.0000 mg | ORAL_TABLET | Freq: Every day | ORAL | 4 refills | Status: DC

## 2018-05-05 MED ORDER — HYDROCHLOROTHIAZIDE 25 MG PO TABS: 25.0000 mg | ORAL_TABLET | Freq: Every day | ORAL | 4 refills | Status: DC

## 2018-05-05 MED ORDER — SILDENAFIL CITRATE 20 MG PO TABS: ORAL_TABLET | ORAL | 12 refills | Status: DC

## 2018-05-05 NOTE — Assessment & Plan Note (Signed)
The current medical regimen is effective;  continue present plan and medications.  

## 2018-05-05 NOTE — Progress Notes (Signed)
BP 134/88   Pulse 68   Ht 5' 9.69" (1.77 m)   Wt 227 lb (103 kg)   SpO2 99%   BMI 32.87 kg/m    Subjective:    Patient ID: Isaiah Terrell, male    DOB: 1971/04/10, 47 y.o.   MRN: [id number]  HPI: Isaiah Terrell is a 48 y.o. male  Chief Complaint  Patient presents with  . Annual Exam  Patient all in all doing well blood pressure good control no complaints from medications has had edema problems in the past but no further edema.  Is also had problems with coughing in the past but that has resolved. ED stable on meds  Relevant past medical, surgical, family and social history reviewed and updated as indicated. Interim medical history since our last visit reviewed. Allergies and medications reviewed and updated.  Review of Systems  Constitutional: Negative.   HENT: Negative.   Eyes: Negative.   Respiratory: Negative.   Cardiovascular: Negative.   Gastrointestinal: Negative.   Endocrine: Negative.   Genitourinary: Negative.   Musculoskeletal: Negative.   Skin: Negative.   Allergic/Immunologic: Negative.   Neurological: Negative.   Hematological: Negative.   Psychiatric/Behavioral: Negative.     Per HPI unless specifically indicated above     Objective:    BP 134/88   Pulse 68   Ht 5' 9.69" (1.77 m)   Wt 227 lb (103 kg)   SpO2 99%   BMI 32.87 kg/m   Wt Readings from Last 3 Encounters:  05/05/18 227 lb (103 kg)  03/13/18 226 lb 8 oz (102.7 kg)  01/30/18 236 lb (107 kg)    Physical Exam  Constitutional: He is oriented to person, place, and time. He appears well-developed and well-nourished.  HENT:  Head: Normocephalic and atraumatic.  Right Ear: External ear normal.  Left Ear: External ear normal.  Eyes: Pupils are equal, round, and reactive to light. Conjunctivae and EOM are normal.  Neck: Normal range of motion. Neck supple.  Cardiovascular: Normal rate, regular rhythm, normal heart sounds and intact distal pulses.  Pulmonary/Chest: Effort normal and breath  sounds normal.  Abdominal: Soft. Bowel sounds are normal. There is no splenomegaly or hepatomegaly.  Genitourinary: Rectum normal, prostate normal and penis normal.  Musculoskeletal: Normal range of motion.  Neurological: He is alert and oriented to person, place, and time. He has normal reflexes.  Skin: No rash noted. No erythema.  Psychiatric: He has a normal mood and affect. His behavior is normal. Judgment and thought content normal.    Results for orders placed or performed in visit on 03/13/18  Basic metabolic panel  Result Value Ref Range   Glucose 98 65 - 99 mg/dL   BUN 20 6 - 24 mg/dL   Creatinine, Ser 4.69 0.76 - 1.27 mg/dL   GFR calc non Af Amer 97 >59 mL/min/1.73   GFR calc Af Amer 112 >59 mL/min/1.73   BUN/Creatinine Ratio 22 (H) 9 - 20   Sodium 140 134 - 144 mmol/L   Potassium 3.5 3.5 - 5.2 mmol/L   Chloride 100 96 - 106 mmol/L   CO2 24 20 - 29 mmol/L   Calcium 10.1 8.7 - 10.2 mg/dL      Assessment & Plan:   Problem List Items Addressed This Visit      Cardiovascular and Mediastinum   Essential hypertension - Primary    The current medical regimen is effective;  continue present plan and medications.       Relevant Medications  amLODipine (NORVASC) 5 MG tablet   hydrochlorothiazide (HYDRODIURIL) 25 MG tablet   losartan (COZAAR) 100 MG tablet   sildenafil (REVATIO) 20 MG tablet   Other Relevant Orders   CBC with Differential/Platelet   Comprehensive metabolic panel   Lipid panel   Urinalysis, Routine w reflex microscopic    Other Visit Diagnoses    Poison ivy dermatitis       Elevated TSH       Relevant Orders   TSH   Annual physical exam       Relevant Orders   CBC with Differential/Platelet   Comprehensive metabolic panel   Lipid panel   PSA   TSH   Urinalysis, Routine w reflex microscopic   Screening for diabetes mellitus (DM)       Relevant Orders   CBC with Differential/Platelet   Comprehensive metabolic panel   Lipid panel    Urinalysis, Routine w reflex microscopic   Screening cholesterol level       Relevant Orders   CBC with Differential/Platelet   Comprehensive metabolic panel   Lipid panel   Urinalysis, Routine w reflex microscopic   Prostate cancer screening       Relevant Orders   PSA   Thyroid disorder screen       Relevant Orders   TSH     ED stable  Follow up plan: Return in about 6 months (around 11/05/2018) for BMP.

## 2018-05-06 LAB — CBC WITH DIFFERENTIAL/PLATELET
BASOS: 1 %
Basophils Absolute: 0 10*3/uL (ref 0.0–0.2)
EOS (ABSOLUTE): 0 10*3/uL (ref 0.0–0.4)
Eos: 0 %
HEMATOCRIT: 47.4 % (ref 37.5–51.0)
Hemoglobin: 15.9 g/dL (ref 13.0–17.7)
IMMATURE GRANULOCYTES: 0 %
Immature Grans (Abs): 0 10*3/uL (ref 0.0–0.1)
Lymphocytes Absolute: 1.7 10*3/uL (ref 0.7–3.1)
Lymphs: 23 %
MCH: 28.5 pg (ref 26.6–33.0)
MCHC: 33.5 g/dL (ref 31.5–35.7)
MCV: 85 fL (ref 79–97)
MONOS ABS: 0.5 10*3/uL (ref 0.1–0.9)
Monocytes: 7 %
NEUTROS ABS: 4.8 10*3/uL (ref 1.4–7.0)
NEUTROS PCT: 69 %
Platelets: 309 10*3/uL (ref 150–450)
RBC: 5.57 x10E6/uL (ref 4.14–5.80)
RDW: 13.4 % (ref 12.3–15.4)
WBC: 7.1 10*3/uL (ref 3.4–10.8)

## 2018-05-06 LAB — COMPREHENSIVE METABOLIC PANEL
ALBUMIN: 4.8 g/dL (ref 3.5–5.5)
ALK PHOS: 77 IU/L (ref 39–117)
ALT: 57 IU/L — ABNORMAL HIGH (ref 0–44)
AST: 30 IU/L (ref 0–40)
Albumin/Globulin Ratio: 2.1 (ref 1.2–2.2)
BUN / CREAT RATIO: 20 (ref 9–20)
BUN: 19 mg/dL (ref 6–24)
Bilirubin Total: 0.8 mg/dL (ref 0.0–1.2)
CO2: 24 mmol/L (ref 20–29)
CREATININE: 0.96 mg/dL (ref 0.76–1.27)
Calcium: 10.3 mg/dL — ABNORMAL HIGH (ref 8.7–10.2)
Chloride: 97 mmol/L (ref 96–106)
GFR calc Af Amer: 108 mL/min/{1.73_m2} (ref >59)
GFR calc non Af Amer: 93 mL/min/{1.73_m2} (ref >59)
Globulin, Total: 2.3 g/dL (ref 1.5–4.5)
Glucose: 85 mg/dL (ref 65–99)
Potassium: 4.4 mmol/L (ref 3.5–5.2)
SODIUM: 139 mmol/L (ref 134–144)
Total Protein: 7.1 g/dL (ref 6.0–8.5)

## 2018-05-06 LAB — LIPID PANEL
CHOL/HDL RATIO: 5.5 ratio — AB (ref 0.0–5.0)
CHOLESTEROL TOTAL: 188 mg/dL (ref 100–199)
HDL: 34 mg/dL — ABNORMAL LOW (ref >39)
LDL CALC: 117 mg/dL — AB (ref 0–99)
Triglycerides: 184 mg/dL — ABNORMAL HIGH (ref 0–149)
VLDL Cholesterol Cal: 37 mg/dL (ref 5–40)

## 2018-05-06 LAB — TSH: TSH: 3.96 u[IU]/mL (ref 0.450–4.500)

## 2018-05-06 LAB — PSA: Prostate Specific Ag, Serum: 1.3 ng/mL (ref 0.0–4.0)

## 2018-05-07 ENCOUNTER — Telehealth: Payer: Self-pay | Admitting: Family Medicine

## 2018-05-07 NOTE — Telephone Encounter (Signed)
Phone call Discussed with patient slight elevation in liver enzyme patient not doing any liver toxic agents such as Tylenol or alcohol will observe recheck this winter office visit.

## 2018-05-07 NOTE — Telephone Encounter (Signed)
-----   Message from Richarda OverlieJada A [place], [place] sent at 05/07/2018 12:07 PM EDT ----- Patient was transferred to provider for telephone conversation.

## 2018-11-05 ENCOUNTER — Ambulatory Visit (INDEPENDENT_AMBULATORY_CARE_PROVIDER_SITE_OTHER): Payer: 59 | Admitting: Family Medicine

## 2018-11-05 ENCOUNTER — Encounter: Payer: Self-pay | Admitting: Family Medicine

## 2018-11-05 VITALS — BP 128/87 | HR 75 | Temp 97.8°F | Ht 69.69 in | Wt 236.0 lb

## 2018-11-05 DIAGNOSIS — I10 Essential (primary) hypertension: Secondary | ICD-10-CM

## 2018-11-05 NOTE — Progress Notes (Signed)
BP 128/87   Pulse 75   Temp 97.8 F (36.6 C) (Oral)   Ht 5' 9.69" (1.77 m)   Wt 236 lb (107 kg)   SpO2 98%   BMI 34.16 kg/m    Subjective:    Patient ID: Isaiah Terrell, male    DOB: [birth year], 48 y.o.   MRN: [id number]  HPI: Isaiah Terrell is a 48 y.o. male  Chief Complaint  Patient presents with  . Follow-up  . Hypertension   Patient follow-up hypertension taking medications without problems having some difficulty with getting losartan due to production issues but otherwise doing well with no side effects and good control of blood pressure. Relevant past medical, surgical, family and social history reviewed and updated as indicated. Interim medical history since our last visit reviewed. Allergies and medications reviewed and updated.  Review of Systems  Constitutional: Negative.   Respiratory: Negative.   Cardiovascular: Negative.     Per HPI unless specifically indicated above     Objective:    BP 128/87   Pulse 75   Temp 97.8 F (36.6 C) (Oral)   Ht 5' 9.69" (1.77 m)   Wt 236 lb (107 kg)   SpO2 98%   BMI 34.16 kg/m   Wt Readings from Last 3 Encounters:  11/05/18 236 lb (107 kg)  05/05/18 227 lb (103 kg)  03/13/18 226 lb 8 oz (102.7 kg)    Physical Exam Constitutional:      Appearance: He is well-developed.  HENT:     Head: Normocephalic and atraumatic.  Eyes:     Conjunctiva/sclera: Conjunctivae normal.  Neck:     Musculoskeletal: Normal range of motion.  Cardiovascular:     Rate and Rhythm: Normal rate and regular rhythm.     Heart sounds: Normal heart sounds.  Pulmonary:     Effort: Pulmonary effort is normal.     Breath sounds: Normal breath sounds.  Musculoskeletal: Normal range of motion.  Skin:    Findings: No erythema.  Neurological:     Mental Status: He is alert and oriented to person, place, and time.  Psychiatric:        Behavior: Behavior normal.        Thought Content: Thought content normal.        Judgment: Judgment normal.      Results for orders placed or performed in visit on 05/05/18  Microscopic Examination  Result Value Ref Range   WBC, UA 0-5 0 - 5 /hpf   RBC, UA 0-2 0 - 2 /hpf   Epithelial Cells (non renal) CANCELED    Bacteria, UA None seen None seen/Few  CBC with Differential/Platelet  Result Value Ref Range   WBC 7.1 3.4 - 10.8 x10E3/uL   RBC 5.57 4.14 - 5.80 x10E6/uL   Hemoglobin 15.9 13.0 - 17.7 g/dL   Hematocrit 53.0 05.1 - 51.0 %   MCV 85 79 - 97 fL   MCH 28.5 26.6 - 33.0 pg   MCHC 33.5 31.5 - 35.7 g/dL   RDW 10.2 11.1 - 73.5 %   Platelets 309 150 - 450 x10E3/uL   Neutrophils 69 Not Estab. %   Lymphs 23 Not Estab. %   Monocytes 7 Not Estab. %   Eos 0 Not Estab. %   Basos 1 Not Estab. %   Neutrophils Absolute 4.8 1.4 - 7.0 x10E3/uL   Lymphocytes Absolute 1.7 0.7 - 3.1 x10E3/uL   Monocytes Absolute 0.5 0.1 - 0.9 x10E3/uL   EOS (ABSOLUTE) 0.0  0.0 - 0.4 x10E3/uL   Basophils Absolute 0.0 0.0 - 0.2 x10E3/uL   Immature Granulocytes 0 Not Estab. %   Immature Grans (Abs) 0.0 0.0 - 0.1 x10E3/uL  Comprehensive metabolic panel  Result Value Ref Range   Glucose 85 65 - 99 mg/dL   BUN 19 6 - 24 mg/dL   Creatinine, Ser 6.75 0.76 - 1.27 mg/dL   GFR calc non Af Amer 93 >59 mL/min/1.73   GFR calc Af Amer 108 >59 mL/min/1.73   BUN/Creatinine Ratio 20 9 - 20   Sodium 139 134 - 144 mmol/L   Potassium 4.4 3.5 - 5.2 mmol/L   Chloride 97 96 - 106 mmol/L   CO2 24 20 - 29 mmol/L   Calcium 10.3 (H) 8.7 - 10.2 mg/dL   Total Protein 7.1 6.0 - 8.5 g/dL   Albumin 4.8 3.5 - 5.5 g/dL   Globulin, Total 2.3 1.5 - 4.5 g/dL   Albumin/Globulin Ratio 2.1 1.2 - 2.2   Bilirubin Total 0.8 0.0 - 1.2 mg/dL   Alkaline Phosphatase 77 39 - 117 IU/L   AST 30 0 - 40 IU/L   ALT 57 (H) 0 - 44 IU/L  Lipid panel  Result Value Ref Range   Cholesterol, Total 188 100 - 199 mg/dL   Triglycerides 916 (H) 0 - 149 mg/dL   HDL 34 (L) >38 mg/dL   VLDL Cholesterol Cal 37 5 - 40 mg/dL   LDL Calculated 466 (H) 0 - 99 mg/dL    Chol/HDL Ratio 5.5 (H) 0.0 - 5.0 ratio  PSA  Result Value Ref Range   Prostate Specific Ag, Serum 1.3 0.0 - 4.0 ng/mL  TSH  Result Value Ref Range   TSH 3.960 0.450 - 4.500 uIU/mL  Urinalysis, Routine w reflex microscopic  Result Value Ref Range   Specific Gravity, UA <1.005 (L) 1.005 - 1.030   pH, UA 5.5 5.0 - 7.5   Color, UA Straw Yellow   Appearance Ur Clear Clear   Leukocytes, UA Negative Negative   Protein, UA Negative Negative/Trace   Glucose, UA Negative Negative   Ketones, UA Negative Negative   RBC, UA Trace (A) Negative   Bilirubin, UA Negative Negative   Urobilinogen, Ur 0.2 0.2 - 1.0 mg/dL   Nitrite, UA Negative Negative   Microscopic Examination See below:       Assessment & Plan:   Problem List Items Addressed This Visit      Cardiovascular and Mediastinum   Essential hypertension - Primary    The current medical regimen is effective;  continue present plan and medications.       Relevant Orders   Basic metabolic panel       Follow up plan: Return in about 6 months (around 05/06/2019) for Physical Exam.

## 2018-11-05 NOTE — Assessment & Plan Note (Signed)
The current medical regimen is effective;  continue present plan and medications.  

## 2018-11-06 LAB — BASIC METABOLIC PANEL
BUN / CREAT RATIO: 21 — AB (ref 9–20)
BUN: 23 mg/dL (ref 6–24)
CALCIUM: 10.1 mg/dL (ref 8.7–10.2)
CO2: 22 mmol/L (ref 20–29)
CREATININE: 1.07 mg/dL (ref 0.76–1.27)
Chloride: 100 mmol/L (ref 96–106)
GFR calc non Af Amer: 81 mL/min/{1.73_m2} (ref >59)
GFR, EST AFRICAN AMERICAN: 94 mL/min/{1.73_m2} (ref >59)
Glucose: 104 mg/dL — ABNORMAL HIGH (ref 65–99)
Potassium: 4.4 mmol/L (ref 3.5–5.2)
Sodium: 138 mmol/L (ref 134–144)

## 2018-11-08 ENCOUNTER — Encounter: Payer: Self-pay | Admitting: Family Medicine

## 2019-05-12 ENCOUNTER — Ambulatory Visit (INDEPENDENT_AMBULATORY_CARE_PROVIDER_SITE_OTHER): Payer: 59 | Admitting: Family Medicine

## 2019-05-12 ENCOUNTER — Other Ambulatory Visit: Payer: Self-pay

## 2019-05-12 ENCOUNTER — Encounter: Payer: Self-pay | Admitting: Family Medicine

## 2019-05-12 DIAGNOSIS — I10 Essential (primary) hypertension: Secondary | ICD-10-CM | POA: Diagnosis not present

## 2019-05-12 DIAGNOSIS — Z0000 Encounter for general adult medical examination without abnormal findings: Secondary | ICD-10-CM

## 2019-05-12 MED ORDER — HYDROCHLOROTHIAZIDE 25 MG PO TABS: 25.0000 mg | ORAL_TABLET | Freq: Every day | ORAL | 4 refills | Status: DC

## 2019-05-12 MED ORDER — LOSARTAN POTASSIUM 100 MG PO TABS: 100.0000 mg | ORAL_TABLET | Freq: Every day | ORAL | 4 refills | Status: DC

## 2019-05-12 MED ORDER — AMLODIPINE BESYLATE 5 MG PO TABS: 5.0000 mg | ORAL_TABLET | Freq: Every day | ORAL | 4 refills | Status: DC

## 2019-05-12 MED ORDER — SILDENAFIL CITRATE 20 MG PO TABS: ORAL_TABLET | ORAL | 12 refills | Status: DC

## 2019-05-12 NOTE — Progress Notes (Addendum)
There were no vitals taken for this visit.   Subjective:    Patient ID: Isaiah Terrell, male    DOB: 11/28/1970, 48 y.o.   MRN: [id number]  HPI: Isaiah Terrell is a 48 y.o. male  Med check Discussed with patient all in all doing well no complaints from medication taking medications faithfully without problems.  Has had problems in the past of getting access to losartan but that is not an issue anymore. No further problems with leg edema or swelling. ED is doing well with Viagra.  Relevant past medical, surgical, family and social history reviewed and updated as indicated. Interim medical history since our last visit reviewed. Allergies and medications reviewed and updated.  Review of Systems  Constitutional: Negative.   HENT: Negative.   Eyes: Negative.   Respiratory: Negative.   Cardiovascular: Negative.   Gastrointestinal: Negative.   Endocrine: Negative.   Genitourinary: Negative.   Musculoskeletal: Negative.   Skin: Negative.   Allergic/Immunologic: Negative.   Neurological: Negative.   Hematological: Negative.   Psychiatric/Behavioral: Negative.     Per HPI unless specifically indicated above     Objective:    There were no vitals taken for this visit.  Wt Readings from Last 3 Encounters:  11/05/18 236 lb (107 kg)  05/05/18 227 lb (103 kg)  03/13/18 226 lb 8 oz (102.7 kg)    Physical Exam  Results for orders placed or performed in visit on 32/44/01  Basic metabolic panel  Result Value Ref Range   Glucose 104 (H) 65 - 99 mg/dL   BUN 23 6 - 24 mg/dL   Creatinine, Ser 1.07 0.76 - 1.27 mg/dL   GFR calc non Af Amer 81 >59 mL/min/1.73   GFR calc Af Amer 94 >59 mL/min/1.73   BUN/Creatinine Ratio 21 (H) 9 - 20   Sodium 138 134 - 144 mmol/L   Potassium 4.4 3.5 - 5.2 mmol/L   Chloride 100 96 - 106 mmol/L   CO2 22 20 - 29 mmol/L   Calcium 10.1 8.7 - 10.2 mg/dL      Assessment & Plan:   Problem List Items Addressed This Visit      Cardiovascular and Mediastinum    Essential hypertension    The current medical regimen is effective;  continue present plan and medications.       Relevant Medications   losartan (COZAAR) 100 MG tablet   sildenafil (REVATIO) 20 MG tablet   hydrochlorothiazide (HYDRODIURIL) 25 MG tablet   amLODipine (NORVASC) 5 MG tablet    Other Visit Diagnoses    PE (physical exam), annual    -  Primary   Relevant Orders   Comprehensive metabolic panel   Lipid panel   CBC with Differential/Platelet   TSH   PSA   Urinalysis, Routine w reflex microscopic    ED stable  Telemedicine using audio/video telecommunications for a synchronous communication visit. Today's visit due to COVID-19 isolation precautions I connected with and verified that I am speaking with the correct person using two identifiers.   I discussed the limitations, risks, security and privacy concerns of performing an evaluation and management service by telecommunication and the availability of in person appointments. I also discussed with the patient that there may be a patient responsible charge related to this service. The patient expressed understanding and agreed to proceed. The patient's location is work. I am at home.   I discussed the assessment and treatment plan with the patient. The patient was provided an opportunity  to ask questions and all were answered. The patient agreed with the plan and demonstrated an understanding of the instructions.   The patient was advised to call back or seek an in-person evaluation if the symptoms worsen or if the condition fails to improve as anticipated.   I provided 21+ minutes of time during this encounter. Follow up plan: Return in about 6 months (around 11/12/2019) for BMP.

## 2019-05-12 NOTE — Assessment & Plan Note (Signed)
The current medical regimen is effective;  continue present plan and medications.  

## 2019-06-30 ENCOUNTER — Ambulatory Visit (INDEPENDENT_AMBULATORY_CARE_PROVIDER_SITE_OTHER): Payer: 59 | Admitting: Family Medicine

## 2019-06-30 ENCOUNTER — Other Ambulatory Visit: Payer: Self-pay

## 2019-06-30 ENCOUNTER — Encounter: Payer: Self-pay | Admitting: Family Medicine

## 2019-06-30 VITALS — BP 151/98 | HR 80 | Temp 99.1°F | Ht 70.4 in | Wt 250.4 lb

## 2019-06-30 DIAGNOSIS — I10 Essential (primary) hypertension: Secondary | ICD-10-CM

## 2019-06-30 DIAGNOSIS — Z23 Encounter for immunization: Secondary | ICD-10-CM | POA: Diagnosis not present

## 2019-06-30 DIAGNOSIS — Z0000 Encounter for general adult medical examination without abnormal findings: Secondary | ICD-10-CM | POA: Diagnosis not present

## 2019-06-30 DIAGNOSIS — Z6835 Body mass index (BMI) 35.0-35.9, adult: Secondary | ICD-10-CM | POA: Diagnosis not present

## 2019-06-30 DIAGNOSIS — R21 Rash and other nonspecific skin eruption: Secondary | ICD-10-CM

## 2019-06-30 LAB — URINALYSIS, ROUTINE W REFLEX MICROSCOPIC
Bilirubin, UA: NEGATIVE
Glucose, UA: NEGATIVE
Ketones, UA: NEGATIVE
Leukocytes,UA: NEGATIVE
Nitrite, UA: NEGATIVE
Protein,UA: NEGATIVE
RBC, UA: NEGATIVE
Specific Gravity, UA: <1.005 — ABNORMAL LOW (ref 1.005–1.030)
Urobilinogen, Ur: 0.2 mg/dL (ref 0.2–1.0)
pH, UA: 6 (ref 5.0–7.5)

## 2019-06-30 MED ORDER — TRIAMCINOLONE ACETONIDE 0.1 % EX CREA: 1.0000 "application " | TOPICAL_CREAM | Freq: Two times a day (BID) | CUTANEOUS | 0 refills | Status: DC

## 2019-06-30 NOTE — Progress Notes (Signed)
BP (!) 151/98 (BP Location: Left Arm, Cuff Size: Normal)   Pulse 80   Temp 99.1 F (37.3 C) (Oral)   Ht 5' 10.4" (1.788 m)   Wt 250 lb 6.4 oz (113.6 kg)   SpO2 97%   BMI 35.52 kg/m    Subjective:    Patient ID: Isaiah Terrell, male    DOB: 10-20-1970, 48 y.o.   MRN: [id number]  HPI: Isaiah Terrell is a 48 y.o. male presenting on 06/30/2019 for comprehensive medical examination. Current medical complaints include:see below  Poison sumac on b/l legs and arms from working outside x 1 week. Trying some OTC products topically without much relief. Very itchy but otherwise no major issues from it.   Taking losartan amlodipine and HCTZ regularly for HTN. Does not have a functional home monitor so has not been tracking home readings. Does have some occasional headaches that he thinks may be blood pressure related. Denies CP, SOB, dizziness, syncope.   He currently lives with: Interim Problems from his last visit: no  Depression Screen done today and results listed below:  Depression screen [facility] 2/9 06/30/2019 05/05/2018 06/26/2017 06/05/2017 05/06/2017  Decreased Interest 0 0 0 0 0  Down, Depressed, Hopeless 0 0 0 0 0  PHQ - 2 Score 0 0 0 0 0  Altered sleeping 0 - - - -  Tired, decreased energy 0 - - - -  Change in appetite 0 - - - -  Feeling bad or failure about yourself  0 - - - -  Trouble concentrating 0 - - - -  Moving slowly or fidgety/restless 0 - - - -  Suicidal thoughts 0 - - - -  PHQ-9 Score 0 - - - -  Difficult doing work/chores Not difficult at all - - - -    The patient does not have a history of falls. I did not complete a risk assessment for falls. A plan of care for falls was not documented.   Past Medical History:  History reviewed. No pertinent past medical history.  Surgical History:  Past Surgical History:  Procedure Laterality Date  . HERNIA REPAIR    . HYDROCELE EXCISION / REPAIR    . KNEE CARTILAGE SURGERY Left 2008    Medications:  Current Outpatient  Medications on File Prior to Visit  Medication Sig  . amLODipine (NORVASC) 5 MG tablet Take 1 tablet (5 mg total) by mouth daily.  . hydrochlorothiazide (HYDRODIURIL) 25 MG tablet Take 1 tablet (25 mg total) by mouth daily.  Marland Kitchen losartan (COZAAR) 100 MG tablet Take 1 tablet (100 mg total) by mouth daily.  . sildenafil (REVATIO) 20 MG tablet Take 1-5 tablets prn  . [DISCONTINUED] metoprolol succinate (TOPROL-XL) 25 MG 24 hr tablet Take 1 tablet (25 mg total) by mouth daily.   No current facility-administered medications on file prior to visit.     Allergies:  Allergies  Allergen Reactions  . Benazepril Cough  . Amlodipine Other (See Comments)    Mild edema with 10 mg dose does fine with 5mg     Social History:  Social History   Socioeconomic History  . Marital status: Married    Spouse name: Not on file  . Number of children: Not on file  . Years of education: Not on file  . Highest education level: Not on file  Occupational History  . Not on file  Social Needs  . Financial resource strain: Not on file  . Food insecurity    Worry:  Not on file    Inability: Not on file  . Transportation needs    Medical: Not on file    Non-medical: Not on file  Tobacco Use  . Smoking status: Never Smoker  . Smokeless tobacco: Never Used  Substance and Sexual Activity  . Alcohol use: No  . Drug use: No  . Sexual activity: Not on file  Lifestyle  . Physical activity    Days per week: Not on file    Minutes per session: Not on file  . Stress: Not on file  Relationships  . Social Musicianconnections    Talks on phone: Not on file    Gets together: Not on file    Attends religious service: Not on file    Active member of club or organization: Not on file    Attends meetings of clubs or organizations: Not on file    Relationship status: Not on file  . Intimate partner violence    Fear of current or ex partner: Not on file    Emotionally abused: Not on file    Physically abused: Not on file     Forced sexual activity: Not on file  Other Topics Concern  . Not on file  Social History Narrative  . Not on file   Social History   Tobacco Use  Smoking Status Never Smoker  Smokeless Tobacco Never Used   Social History   Substance and Sexual Activity  Alcohol Use No    Family History:  Family History  Problem Relation Age of Onset  . Cancer Mother        ovarian  . Hypertension Mother   . Hypertension Father   . Stroke Father   . Heart disease Father   . Cancer Father        prostate    Past medical history, surgical history, medications, allergies, family history and social history reviewed with patient today and changes made to appropriate areas of the chart.   Review of Systems - General ROS: negative Psychological ROS: negative Ophthalmic ROS: negative ENT ROS: negative Allergy and Immunology ROS: negative Hematological and Lymphatic ROS: negative Endocrine ROS: negative Respiratory ROS: no cough, shortness of breath, or wheezing Cardiovascular ROS: no chest pain or dyspnea on exertion Gastrointestinal ROS: no abdominal pain, change in bowel habits, or black or bloody stools Genito-Urinary ROS: no dysuria, trouble voiding, or hematuria Musculoskeletal ROS: negative Neurological ROS: positive for - headaches Dermatological ROS: positive for rash All other ROS negative except what is listed above and in the HPI.      Objective:    BP (!) 151/98 (BP Location: Left Arm, Cuff Size: Normal)   Pulse 80   Temp 99.1 F (37.3 C) (Oral)   Ht 5' 10.4" (1.788 m)   Wt 250 lb 6.4 oz (113.6 kg)   SpO2 97%   BMI 35.52 kg/m   Wt Readings from Last 3 Encounters:  06/30/19 250 lb 6.4 oz (113.6 kg)  11/05/18 236 lb (107 kg)  05/05/18 227 lb (103 kg)    Physical Exam Vitals signs and nursing note reviewed.  Constitutional:      General: He is not in acute distress.    Appearance: He is well-developed.  HENT:     Head: Atraumatic.     Right Ear: Tympanic  membrane and external ear normal.     Left Ear: Tympanic membrane and external ear normal.     Nose: Nose normal.     Mouth/Throat:  Mouth: Mucous membranes are moist.     Pharynx: Oropharynx is clear.  Eyes:     General: No scleral icterus.    Conjunctiva/sclera: Conjunctivae normal.     Pupils: Pupils are equal, round, and reactive to light.  Neck:     Musculoskeletal: Normal range of motion and neck supple.  Cardiovascular:     Rate and Rhythm: Normal rate and regular rhythm.     Heart sounds: Normal heart sounds. No murmur.  Pulmonary:     Effort: Pulmonary effort is normal. No respiratory distress.     Breath sounds: Normal breath sounds.  Abdominal:     General: Bowel sounds are normal. There is no distension.     Palpations: Abdomen is soft. There is no mass.     Tenderness: There is no abdominal tenderness. There is no guarding.  Genitourinary:    Comments: Declined GU exam today Musculoskeletal: Normal range of motion.        General: No tenderness.  Skin:    General: Skin is warm and dry.     Findings: Rash (maculopapular rash b/l LEs and some scattered on UEs) present.  Neurological:     General: No focal deficit present.     Mental Status: He is alert and oriented to person, place, and time.     Deep Tendon Reflexes: Reflexes are normal and symmetric.  Psychiatric:        Mood and Affect: Mood normal.        Behavior: Behavior normal.        Thought Content: Thought content normal.        Judgment: Judgment normal.     Results for orders placed or performed in visit on 06/30/19  Urinalysis, Routine w reflex microscopic  Result Value Ref Range   Specific Gravity, UA <1.005 (L) 1.005 - 1.030   pH, UA 6.0 5.0 - 7.5   Color, UA Yellow Yellow   Appearance Ur Clear Clear   Leukocytes,UA Negative Negative   Protein,UA Negative Negative/Trace   Glucose, UA Negative Negative   Ketones, UA Negative Negative   RBC, UA Negative Negative   Bilirubin, UA Negative  Negative   Urobilinogen, Ur 0.2 0.2 - 1.0 mg/dL   Nitrite, UA Negative Negative  PSA  Result Value Ref Range   Prostate Specific Ag, Serum 1.3 0.0 - 4.0 ng/mL  TSH  Result Value Ref Range   TSH 3.300 0.450 - 4.500 uIU/mL  CBC with Differential/Platelet  Result Value Ref Range   WBC 6.4 3.4 - 10.8 x10E3/uL   RBC 5.61 4.14 - 5.80 x10E6/uL   Hemoglobin 15.9 13.0 - 17.7 g/dL   Hematocrit 47.2 37.5 - 51.0 %   MCV 84 79 - 97 fL   MCH 28.3 26.6 - 33.0 pg   MCHC 33.7 31.5 - 35.7 g/dL   RDW 13.6 11.6 - 15.4 %   Platelets 285 150 - 450 x10E3/uL   Neutrophils 67 Not Estab. %   Lymphs 22 Not Estab. %   Monocytes 7 Not Estab. %   Eos 2 Not Estab. %   Basos 1 Not Estab. %   Neutrophils Absolute 4.3 1.4 - 7.0 x10E3/uL   Lymphocytes Absolute 1.4 0.7 - 3.1 x10E3/uL   Monocytes Absolute 0.5 0.1 - 0.9 x10E3/uL   EOS (ABSOLUTE) 0.1 0.0 - 0.4 x10E3/uL   Basophils Absolute 0.1 0.0 - 0.2 x10E3/uL   Immature Granulocytes 1 Not Estab. %   Immature Grans (Abs) 0.0 0.0 - 0.1 x10E3/uL  Lipid panel  Result Value Ref Range   Cholesterol, Total 176 100 - 199 mg/dL   Triglycerides 161241 (H) 0 - 149 mg/dL   HDL 30 (L) >09>39 mg/dL   VLDL Cholesterol Cal 42 (H) 5 - 40 mg/dL   LDL Chol Calc (NIH) 604104 (H) 0 - 99 mg/dL   Chol/HDL Ratio 5.9 (H) 0.0 - 5.0 ratio  Comprehensive metabolic panel  Result Value Ref Range   Glucose 105 (H) 65 - 99 mg/dL   BUN 17 6 - 24 mg/dL   Creatinine, Ser 5.400.99 0.76 - 1.27 mg/dL   GFR calc non Af Amer 89 >59 mL/min/1.73   GFR calc Af Amer 103 >59 mL/min/1.73   BUN/Creatinine Ratio 17 9 - 20   Sodium 141 134 - 144 mmol/L   Potassium 4.3 3.5 - 5.2 mmol/L   Chloride 103 96 - 106 mmol/L   CO2 18 (L) 20 - 29 mmol/L   Calcium 10.1 8.7 - 10.2 mg/dL   Total Protein 6.8 6.0 - 8.5 g/dL   Albumin 4.8 4.0 - 5.0 g/dL   Globulin, Total 2.0 1.5 - 4.5 g/dL   Albumin/Globulin Ratio 2.4 (H) 1.2 - 2.2   Bilirubin Total 0.4 0.0 - 1.2 mg/dL   Alkaline Phosphatase 67 39 - 117 IU/L   AST 37 0 -  40 IU/L   ALT 57 (H) 0 - 44 IU/L      Assessment & Plan:   Problem List Items Addressed This Visit      Cardiovascular and Mediastinum   Essential hypertension - Primary    Will provide script for a new home monitor and have him log his home readings. Follow up in 1 month for recheck on BPs. DASH diet, exercise recommended. Continue current regimen        Other   Obesity    Diet, exercise reviewed at length       Other Visit Diagnoses    Annual physical exam       Rash       Triamcinolone cream sent. Can take benadryl 1-2 times daily as well for relief. F/u if not improving   PE (physical exam), annual       Need for Tdap vaccination       Relevant Orders   Tdap vaccine greater than or equal to 7yo IM (Completed)       Discussed aspirin prophylaxis for myocardial infarction prevention and decision was it was not indicated  LABORATORY TESTING:  Health maintenance labs ordered today as discussed above.   The natural history of prostate cancer and ongoing controversy regarding screening and potential treatment outcomes of prostate cancer has been discussed with the patient. The meaning of a false positive PSA and a false negative PSA has been discussed. He indicates understanding of the limitations of this screening test and wishes to proceed with screening PSA testing.   IMMUNIZATIONS:   - Tdap: Tetanus vaccination status reviewed: Td vaccination indicated and given today. - Influenza: Refused  PATIENT COUNSELING:    Sexuality: Discussed sexually transmitted diseases, partner selection, use of condoms, avoidance of unintended pregnancy  and contraceptive alternatives.   Advised to avoid cigarette smoking.  I discussed with the patient that most people either abstain from alcohol or drink within safe limits (<=14/week and <=4 drinks/occasion for males, <=7/weeks and <= 3 drinks/occasion for females) and that the risk for alcohol disorders and other health effects rises  proportionally with the number of drinks per week and how often  a drinker exceeds daily limits.  Discussed cessation/primary prevention of drug use and availability of treatment for abuse.   Diet: Encouraged to adjust caloric intake to maintain  or achieve ideal body weight, to reduce intake of dietary saturated fat and total fat, to limit sodium intake by avoiding high sodium foods and not adding table salt, and to maintain adequate dietary potassium and calcium preferably from fresh fruits, vegetables, and low-fat dairy products.    stressed the importance of regular exercise  Injury prevention: Discussed safety belts, safety helmets, smoke detector, smoking near bedding or upholstery.   Dental health: Discussed importance of regular tooth brushing, flossing, and dental visits.   Follow up plan: NEXT PREVENTATIVE PHYSICAL DUE IN 1 YEAR. Return in about 4 weeks (around 07/28/2019) for BP.

## 2019-07-01 LAB — COMPREHENSIVE METABOLIC PANEL
ALT: 57 IU/L — ABNORMAL HIGH (ref 0–44)
AST: 37 IU/L (ref 0–40)
Albumin/Globulin Ratio: 2.4 — ABNORMAL HIGH (ref 1.2–2.2)
Albumin: 4.8 g/dL (ref 4.0–5.0)
Alkaline Phosphatase: 67 IU/L (ref 39–117)
BUN/Creatinine Ratio: 17 (ref 9–20)
BUN: 17 mg/dL (ref 6–24)
Bilirubin Total: 0.4 mg/dL (ref 0.0–1.2)
CO2: 18 mmol/L — ABNORMAL LOW (ref 20–29)
Calcium: 10.1 mg/dL (ref 8.7–10.2)
Chloride: 103 mmol/L (ref 96–106)
Creatinine, Ser: 0.99 mg/dL (ref 0.76–1.27)
GFR calc Af Amer: 103 mL/min/{1.73_m2} (ref >59)
GFR calc non Af Amer: 89 mL/min/{1.73_m2} (ref >59)
Globulin, Total: 2 g/dL (ref 1.5–4.5)
Glucose: 105 mg/dL — ABNORMAL HIGH (ref 65–99)
Potassium: 4.3 mmol/L (ref 3.5–5.2)
Sodium: 141 mmol/L (ref 134–144)
Total Protein: 6.8 g/dL (ref 6.0–8.5)

## 2019-07-01 LAB — CBC WITH DIFFERENTIAL/PLATELET
Basophils Absolute: 0.1 10*3/uL (ref 0.0–0.2)
Basos: 1 %
EOS (ABSOLUTE): 0.1 10*3/uL (ref 0.0–0.4)
Eos: 2 %
Hematocrit: 47.2 % (ref 37.5–51.0)
Hemoglobin: 15.9 g/dL (ref 13.0–17.7)
Immature Grans (Abs): 0 10*3/uL (ref 0.0–0.1)
Immature Granulocytes: 1 %
Lymphocytes Absolute: 1.4 10*3/uL (ref 0.7–3.1)
Lymphs: 22 %
MCH: 28.3 pg (ref 26.6–33.0)
MCHC: 33.7 g/dL (ref 31.5–35.7)
MCV: 84 fL (ref 79–97)
Monocytes Absolute: 0.5 10*3/uL (ref 0.1–0.9)
Monocytes: 7 %
Neutrophils Absolute: 4.3 10*3/uL (ref 1.4–7.0)
Neutrophils: 67 %
Platelets: 285 10*3/uL (ref 150–450)
RBC: 5.61 x10E6/uL (ref 4.14–5.80)
RDW: 13.6 % (ref 11.6–15.4)
WBC: 6.4 10*3/uL (ref 3.4–10.8)

## 2019-07-01 LAB — PSA: Prostate Specific Ag, Serum: 1.3 ng/mL (ref 0.0–4.0)

## 2019-07-01 LAB — LIPID PANEL
Chol/HDL Ratio: 5.9 ratio — ABNORMAL HIGH (ref 0.0–5.0)
Cholesterol, Total: 176 mg/dL (ref 100–199)
HDL: 30 mg/dL — ABNORMAL LOW (ref >39)
LDL Chol Calc (NIH): 104 mg/dL — ABNORMAL HIGH (ref 0–99)
Triglycerides: 241 mg/dL — ABNORMAL HIGH (ref 0–149)
VLDL Cholesterol Cal: 42 mg/dL — ABNORMAL HIGH (ref 5–40)

## 2019-07-01 LAB — TSH: TSH: 3.3 u[IU]/mL (ref 0.450–4.500)

## 2019-07-02 ENCOUNTER — Encounter: Payer: Self-pay | Admitting: Family Medicine

## 2019-07-05 DIAGNOSIS — E669 Obesity, unspecified: Secondary | ICD-10-CM | POA: Insufficient documentation

## 2019-07-05 NOTE — Assessment & Plan Note (Signed)
Will provide script for a new home monitor and have him log his home readings. Follow up in 1 month for recheck on BPs. DASH diet, exercise recommended. Continue current regimen

## 2019-07-05 NOTE — Assessment & Plan Note (Signed)
Diet, exercise reviewed at length

## 2019-07-27 ENCOUNTER — Other Ambulatory Visit: Payer: Self-pay

## 2019-07-28 ENCOUNTER — Other Ambulatory Visit: Payer: Self-pay

## 2019-07-28 ENCOUNTER — Ambulatory Visit (INDEPENDENT_AMBULATORY_CARE_PROVIDER_SITE_OTHER): Payer: 59 | Admitting: Family Medicine

## 2019-07-28 ENCOUNTER — Encounter: Payer: Self-pay | Admitting: Family Medicine

## 2019-07-28 VITALS — BP 131/87 | HR 82 | Temp 98.8°F | Ht 70.4 in | Wt 250.0 lb

## 2019-07-28 DIAGNOSIS — I10 Essential (primary) hypertension: Secondary | ICD-10-CM | POA: Diagnosis not present

## 2019-07-28 NOTE — Progress Notes (Signed)
BP 131/87   Pulse 82   Temp 98.8 F (37.1 C) (Oral)   Ht 5' 10.4" (1.788 m)   Wt 250 lb (113.4 kg)   SpO2 97%   BMI 35.46 kg/m    Subjective:    Patient ID: Isaiah Terrell, male    DOB: 1971/07/24, 48 y.o.   MRN: [id number]  HPI: Isaiah Terrell is a 48 y.o. male  Chief Complaint  Patient presents with  . Hypertension   Patient presenting today for 1 month HTN f/u. Readings had been high at CPE last month. Got home BP cuff and has been checking home readings - getting between 115-130/70s when checked daily. Taking his medicine faithfully, not following specific diet or exercising but is active with work. Denies CP, SOB, HAs, dizziness.   Relevant past medical, surgical, family and social history reviewed and updated as indicated. Interim medical history since our last visit reviewed. Allergies and medications reviewed and updated.  Review of Systems  Per HPI unless specifically indicated above     Objective:    BP 131/87   Pulse 82   Temp 98.8 F (37.1 C) (Oral)   Ht 5' 10.4" (1.788 m)   Wt 250 lb (113.4 kg)   SpO2 97%   BMI 35.46 kg/m   Wt Readings from Last 3 Encounters:  07/28/19 250 lb (113.4 kg)  06/30/19 250 lb 6.4 oz (113.6 kg)  11/05/18 236 lb (107 kg)    Physical Exam Vitals signs and nursing note reviewed.  Constitutional:      Appearance: Normal appearance.  HENT:     Head: Atraumatic.  Eyes:     Extraocular Movements: Extraocular movements intact.     Conjunctiva/sclera: Conjunctivae normal.  Neck:     Musculoskeletal: Normal range of motion and neck supple.  Cardiovascular:     Rate and Rhythm: Normal rate and regular rhythm.  Pulmonary:     Effort: Pulmonary effort is normal.     Breath sounds: Normal breath sounds.  Musculoskeletal: Normal range of motion.  Skin:    General: Skin is warm and dry.  Neurological:     General: No focal deficit present.     Mental Status: He is oriented to person, place, and time.  Psychiatric:        Mood  and Affect: Mood normal.        Thought Content: Thought content normal.        Judgment: Judgment normal.     Results for orders placed or performed in visit on 06/30/19  Urinalysis, Routine w reflex microscopic  Result Value Ref Range   Specific Gravity, UA <1.005 (L) 1.005 - 1.030   pH, UA 6.0 5.0 - 7.5   Color, UA Yellow Yellow   Appearance Ur Clear Clear   Leukocytes,UA Negative Negative   Protein,UA Negative Negative/Trace   Glucose, UA Negative Negative   Ketones, UA Negative Negative   RBC, UA Negative Negative   Bilirubin, UA Negative Negative   Urobilinogen, Ur 0.2 0.2 - 1.0 mg/dL   Nitrite, UA Negative Negative  PSA  Result Value Ref Range   Prostate Specific Ag, Serum 1.3 0.0 - 4.0 ng/mL  TSH  Result Value Ref Range   TSH 3.300 0.450 - 4.500 uIU/mL  CBC with Differential/Platelet  Result Value Ref Range   WBC 6.4 3.4 - 10.8 x10E3/uL   RBC 5.61 4.14 - 5.80 x10E6/uL   Hemoglobin 15.9 13.0 - 17.7 g/dL   Hematocrit 47.2 37.5 - 51.0 %  MCV 84 79 - 97 fL   MCH 28.3 26.6 - 33.0 pg   MCHC 33.7 31.5 - 35.7 g/dL   RDW 94.4 96.7 - 59.1 %   Platelets 285 150 - 450 x10E3/uL   Neutrophils 67 Not Estab. %   Lymphs 22 Not Estab. %   Monocytes 7 Not Estab. %   Eos 2 Not Estab. %   Basos 1 Not Estab. %   Neutrophils Absolute 4.3 1.4 - 7.0 x10E3/uL   Lymphocytes Absolute 1.4 0.7 - 3.1 x10E3/uL   Monocytes Absolute 0.5 0.1 - 0.9 x10E3/uL   EOS (ABSOLUTE) 0.1 0.0 - 0.4 x10E3/uL   Basophils Absolute 0.1 0.0 - 0.2 x10E3/uL   Immature Granulocytes 1 Not Estab. %   Immature Grans (Abs) 0.0 0.0 - 0.1 x10E3/uL  Lipid panel  Result Value Ref Range   Cholesterol, Total 176 100 - 199 mg/dL   Triglycerides 638 (H) 0 - 149 mg/dL   HDL 30 (L) >46 mg/dL   VLDL Cholesterol Cal 42 (H) 5 - 40 mg/dL   LDL Chol Calc (NIH) 659 (H) 0 - 99 mg/dL   Chol/HDL Ratio 5.9 (H) 0.0 - 5.0 ratio  Comprehensive metabolic panel  Result Value Ref Range   Glucose 105 (H) 65 - 99 mg/dL   BUN 17 6 -  24 mg/dL   Creatinine, Ser 9.35 0.76 - 1.27 mg/dL   GFR calc non Af Amer 89 >59 mL/min/1.73   GFR calc Af Amer 103 >59 mL/min/1.73   BUN/Creatinine Ratio 17 9 - 20   Sodium 141 134 - 144 mmol/L   Potassium 4.3 3.5 - 5.2 mmol/L   Chloride 103 96 - 106 mmol/L   CO2 18 (L) 20 - 29 mmol/L   Calcium 10.1 8.7 - 10.2 mg/dL   Total Protein 6.8 6.0 - 8.5 g/dL   Albumin 4.8 4.0 - 5.0 g/dL   Globulin, Total 2.0 1.5 - 4.5 g/dL   Albumin/Globulin Ratio 2.4 (H) 1.2 - 2.2   Bilirubin Total 0.4 0.0 - 1.2 mg/dL   Alkaline Phosphatase 67 39 - 117 IU/L   AST 37 0 - 40 IU/L   ALT 57 (H) 0 - 44 IU/L      Assessment & Plan:   Problem List Items Addressed This Visit      Cardiovascular and Mediastinum   Essential hypertension - Primary    Home BPs stable and WNL, and readings WNL today when checked. Continue current regimen and call with abnormal home readings. DASH diet and exercise recommended          Follow up plan: Return for as scheduled for 6 month f/u.

## 2019-07-28 NOTE — Assessment & Plan Note (Signed)
Home BPs stable and WNL, and readings WNL today when checked. Continue current regimen and call with abnormal home readings. DASH diet and exercise recommended

## 2019-11-15 ENCOUNTER — Encounter: Payer: Self-pay | Admitting: Family Medicine

## 2019-11-15 ENCOUNTER — Ambulatory Visit: Payer: Self-pay | Admitting: Family Medicine

## 2019-11-15 ENCOUNTER — Ambulatory Visit (INDEPENDENT_AMBULATORY_CARE_PROVIDER_SITE_OTHER): Payer: 59 | Admitting: Family Medicine

## 2019-11-15 VITALS — BP 130/73 | HR 79 | Ht 70.4 in | Wt 250.0 lb

## 2019-11-15 DIAGNOSIS — I10 Essential (primary) hypertension: Secondary | ICD-10-CM | POA: Diagnosis not present

## 2019-11-15 NOTE — Progress Notes (Signed)
BP 130/73   Pulse 79   Ht 5' 10.4" (1.788 m)   Wt 250 lb (113.4 kg)   BMI 35.46 kg/m    Subjective:    Patient ID: Isaiah Terrell, male    DOB: 1970-11-14, 49 y.o.   MRN: [id number]  HPI: Isaiah Terrell is a 49 y.o. male  Chief Complaint  Patient presents with  . Hypertension    . This visit was completed via WebEx due to the restrictions of the COVID-19 pandemic. All issues as above were discussed and addressed. Physical exam was done as above through visual confirmation on WebEx. If it was felt that the patient should be evaluated in the office, they were directed there. The patient verbally consented to this visit. . Location of the patient: home . Location of the provider: work . Those involved with this call:  . Provider: Roosvelt Maser, PA-C . CMA: Elton Sin, CMA . Front Desk/Registration: Harriet Pho  . Time spent on call: 15 minutes with patient face to face via video conference. More than 50% of this time was spent in counseling and coordination of care. 5 minutes total spent in review of patient's record and preparation of their chart. I verified patient identity using two factors (patient name and date of birth). Patient consents verbally to being seen via telemedicine visit today.   Patient presenting today for 6 month f/u. Taking his medications faithfully without side effects. Denies CP, SOB, HAs, dizziness. Trying to eat well and stay active.   Relevant past medical, surgical, family and social history reviewed and updated as indicated. Interim medical history since our last visit reviewed. Allergies and medications reviewed and updated.  Review of Systems  Per HPI unless specifically indicated above     Objective:    BP 130/73   Pulse 79   Ht 5' 10.4" (1.788 m)   Wt 250 lb (113.4 kg)   BMI 35.46 kg/m   Wt Readings from Last 3 Encounters:  11/15/19 250 lb (113.4 kg)  07/28/19 250 lb (113.4 kg)  06/30/19 250 lb 6.4 oz (113.6 kg)    Physical Exam Vitals  and nursing note reviewed.  Constitutional:      General: He is not in acute distress.    Appearance: Normal appearance.  HENT:     Head: Atraumatic.     Right Ear: External ear normal.     Left Ear: External ear normal.     Nose: Nose normal. No congestion.     Mouth/Throat:     Mouth: Mucous membranes are moist.     Pharynx: Oropharynx is clear.  Eyes:     Extraocular Movements: Extraocular movements intact.     Conjunctiva/sclera: Conjunctivae normal.  Pulmonary:     Effort: Pulmonary effort is normal. No respiratory distress.  Musculoskeletal:        General: Normal range of motion.     Cervical back: Normal range of motion.  Skin:    General: Skin is dry.     Findings: No erythema or rash.  Neurological:     Mental Status: He is oriented to person, place, and time.  Psychiatric:        Mood and Affect: Mood normal.        Thought Content: Thought content normal.        Judgment: Judgment normal.     Results for orders placed or performed in visit on 06/30/19  Urinalysis, Routine w reflex microscopic  Result Value Ref Range  Specific Gravity, UA <1.005 (L) 1.005 - 1.030   pH, UA 6.0 5.0 - 7.5   Color, UA Yellow Yellow   Appearance Ur Clear Clear   Leukocytes,UA Negative Negative   Protein,UA Negative Negative/Trace   Glucose, UA Negative Negative   Ketones, UA Negative Negative   RBC, UA Negative Negative   Bilirubin, UA Negative Negative   Urobilinogen, Ur 0.2 0.2 - 1.0 mg/dL   Nitrite, UA Negative Negative  PSA  Result Value Ref Range   Prostate Specific Ag, Serum 1.3 0.0 - 4.0 ng/mL  TSH  Result Value Ref Range   TSH 3.300 0.450 - 4.500 uIU/mL  CBC with Differential/Platelet  Result Value Ref Range   WBC 6.4 3.4 - 10.8 x10E3/uL   RBC 5.61 4.14 - 5.80 x10E6/uL   Hemoglobin 15.9 13.0 - 17.7 g/dL   Hematocrit 47.2 37.5 - 51.0 %   MCV 84 79 - 97 fL   MCH 28.3 26.6 - 33.0 pg   MCHC 33.7 31.5 - 35.7 g/dL   RDW 13.6 11.6 - 15.4 %   Platelets 285 150 -  450 x10E3/uL   Neutrophils 67 Not Estab. %   Lymphs 22 Not Estab. %   Monocytes 7 Not Estab. %   Eos 2 Not Estab. %   Basos 1 Not Estab. %   Neutrophils Absolute 4.3 1.4 - 7.0 x10E3/uL   Lymphocytes Absolute 1.4 0.7 - 3.1 x10E3/uL   Monocytes Absolute 0.5 0.1 - 0.9 x10E3/uL   EOS (ABSOLUTE) 0.1 0.0 - 0.4 x10E3/uL   Basophils Absolute 0.1 0.0 - 0.2 x10E3/uL   Immature Granulocytes 1 Not Estab. %   Immature Grans (Abs) 0.0 0.0 - 0.1 x10E3/uL  Lipid panel  Result Value Ref Range   Cholesterol, Total 176 100 - 199 mg/dL   Triglycerides 241 (H) 0 - 149 mg/dL   HDL 30 (L) >39 mg/dL   VLDL Cholesterol Cal 42 (H) 5 - 40 mg/dL   LDL Chol Calc (NIH) 104 (H) 0 - 99 mg/dL   Chol/HDL Ratio 5.9 (H) 0.0 - 5.0 ratio  Comprehensive metabolic panel  Result Value Ref Range   Glucose 105 (H) 65 - 99 mg/dL   BUN 17 6 - 24 mg/dL   Creatinine, Ser 0.99 0.76 - 1.27 mg/dL   GFR calc non Af Amer 89 >59 mL/min/1.73   GFR calc Af Amer 103 >59 mL/min/1.73   BUN/Creatinine Ratio 17 9 - 20   Sodium 141 134 - 144 mmol/L   Potassium 4.3 3.5 - 5.2 mmol/L   Chloride 103 96 - 106 mmol/L   CO2 18 (L) 20 - 29 mmol/L   Calcium 10.1 8.7 - 10.2 mg/dL   Total Protein 6.8 6.0 - 8.5 g/dL   Albumin 4.8 4.0 - 5.0 g/dL   Globulin, Total 2.0 1.5 - 4.5 g/dL   Albumin/Globulin Ratio 2.4 (H) 1.2 - 2.2   Bilirubin Total 0.4 0.0 - 1.2 mg/dL   Alkaline Phosphatase 67 39 - 117 IU/L   AST 37 0 - 40 IU/L   ALT 57 (H) 0 - 44 IU/L      Assessment & Plan:   Problem List Items Addressed This Visit      Cardiovascular and Mediastinum   Essential hypertension - Primary    BPs stable and WNL, continue current regimen      Relevant Orders   Comprehensive metabolic panel       Follow up plan: Return in about 6 months (around 05/14/2020) for CPE.

## 2019-11-18 NOTE — Assessment & Plan Note (Signed)
BPs stable and WNL, continue current regimen 

## 2019-11-19 ENCOUNTER — Telehealth: Payer: Self-pay | Admitting: Family Medicine

## 2019-11-19 NOTE — Telephone Encounter (Signed)
-----   Message from Particia Nearing, [place] sent at 11/18/2019 10:25 PM EST ----- 6 months CPE

## 2019-11-19 NOTE — Telephone Encounter (Signed)
LVM for pt to call back to schedule appt

## 2019-12-02 ENCOUNTER — Other Ambulatory Visit: Payer: 59

## 2020-07-03 ENCOUNTER — Other Ambulatory Visit: Payer: Self-pay | Admitting: Nurse Practitioner

## 2020-07-03 DIAGNOSIS — I10 Essential (primary) hypertension: Secondary | ICD-10-CM

## 2020-07-03 NOTE — Telephone Encounter (Signed)
Called patient. He has an appointment next week on 07/10/20 but is out of medications, states he ran out yesterday. Still using [place] for pharmacy.

## 2020-07-03 NOTE — Telephone Encounter (Signed)
Requested medication (s) are due for refill today: Yes  Requested medication (s) are on the active medication list: Yes  Last refill:  1 year ago  Future visit scheduled: Yes  Notes to clinic:  Unable to refill per protocol, expired Rx     Requested Prescriptions  Pending Prescriptions Disp Refills   losartan (COZAAR) 100 MG tablet [Pharmacy Med Name: LOSARTAN POTASSIUM 100 MG TAB] 90 tablet 0    Sig: Take 1 tablet (100 mg total) by mouth daily.      Cardiovascular:  Angiotensin Receptor Blockers Failed - 07/03/2020  1:01 PM      Failed - Cr in normal range and within 180 days    Creatinine, Ser  Date Value Ref Range Status  06/30/2019 0.99 0.76 - 1.27 mg/dL Final          Failed - K in normal range and within 180 days    Potassium  Date Value Ref Range Status  06/30/2019 4.3 3.5 - 5.2 mmol/L Final          Failed - Valid encounter within last 6 months    Recent Outpatient Visits           7 months ago Essential hypertension   [facility] Particia Nearing, [place]   11 months ago Essential hypertension   [facility] Particia Nearing, [place]   1 year ago Essential hypertension   [facility] [place], Salley Hews, [place]   1 year ago PE (physical exam), annual   [address] [facility], Redge Gainer, [place]   1 year ago Essential hypertension   [facility], Redge Gainer, [place]       Future Appointments             In 1 week Valentino Nose, NP [facility]            Passed - Patient is not pregnant      Passed - Last BP in normal range    BP Readings from Last 1 Encounters:  11/15/19 130/73            hydrochlorothiazide (HYDRODIURIL) 25 MG tablet [Pharmacy Med Name: HYDROCHLOROTHIAZIDE 25 MG TAB] 90 tablet 0    Sig: Take 1 tablet (25 mg total) by mouth daily.      Cardiovascular: Diuretics - Thiazide Failed - 07/03/2020  1:01 PM      Failed - Ca in normal range and  within 360 days    Calcium  Date Value Ref Range Status  06/30/2019 10.1 8.7 - 10.2 mg/dL Final          Failed - Cr in normal range and within 360 days    Creatinine, Ser  Date Value Ref Range Status  06/30/2019 0.99 0.76 - 1.27 mg/dL Final          Failed - K in normal range and within 360 days    Potassium  Date Value Ref Range Status  06/30/2019 4.3 3.5 - 5.2 mmol/L Final          Failed - Na in normal range and within 360 days    Sodium  Date Value Ref Range Status  06/30/2019 141 134 - 144 mmol/L Final          Failed - Valid encounter within last 6 months    Recent Outpatient Visits           7 months ago Essential hypertension   [facility] Particia Nearing, [place]   11 months ago Essential hypertension   [facility] Roosvelt Maser [place], [place]   1 year ago Essential hypertension   [facility] Roosvelt Maser [place], [place]   1 year ago PE (physical exam), annual   [facility] [place], Redge Gainer, [place]   1 year ago Essential hypertension   [facility] Steele Sizer, MD       Future Appointments             In 1 week Valentino Nose, NP [facility]            Passed - Last BP in normal range    BP Readings from Last 1 Encounters:  11/15/19 130/73            amLODipine (NORVASC) 5 MG tablet [Pharmacy Med Name: AMLODIPINE BESYLATE 5 MG TAB] 90 tablet 0    Sig: Take 1 tablet (5 mg total) by mouth daily.      Cardiovascular:  Calcium Channel Blockers Failed - 07/03/2020  1:01 PM      Failed - Valid encounter within last 6 months    Recent Outpatient Visits           7 months ago Essential hypertension   [facility] Roosvelt Maser [place], [place]   11 months ago Essential hypertension   [facility] Particia Nearing, [place]   1 year ago Essential hypertension   [facility] Particia Nearing, [place]   1 year ago  PE (physical exam), annual   [facility] [place], Redge Gainer, [place]   1 year ago Essential hypertension   [facility] [place], Redge Gainer, [place]       Future Appointments             In 1 week Valentino Nose, NP [facility]            Passed - Last BP in normal range    BP Readings from Last 1 Encounters:  11/15/19 130/73

## 2020-07-09 NOTE — Progress Notes (Signed)
BP (!) 151/101 (BP Location: Left Arm, Patient Position: Sitting, Cuff Size: Large)   Pulse 72   Temp 97.9 F (36.6 C) (Oral)   Resp 16   Ht 5' 10.5" (1.791 m)   Wt 264 lb 3.2 oz (119.8 kg)   SpO2 97%   BMI 37.37 kg/m    Subjective:    Patient ID: Isaiah Terrell, male    DOB: 09/20/1971, 49 y.o.   MRN: [id number]  HPI: Isaiah KyleDavid Yoss is a 49 y.o. male presenting on 07/10/2020 for comprehensive medical examination. Current medical complaints include:  HYPERTENSION  Hypertension status: uncontrolled  Satisfied with current treatment? no Duration of hypertension: chronic BP monitoring frequency:  not checking BP medication side effects:  no Medication compliance: excellent compliance Previous BP meds: amlodipin 5 mg, HCTZ 25, losartan 100 mg Aspirin: no Recurrent headaches: no Visual changes: no Palpitations: no Dyspnea: no Chest pain: no Lower extremity edema: no Dizzy/lightheaded: no  He currently lives with: wife Interim Problems from his last visit: no  Depression Screen done today and results listed below:  Depression screen [facility] 2/9 07/10/2020 06/30/2019 05/05/2018 06/26/2017 06/05/2017  Decreased Interest 0 0 0 0 0  Down, Depressed, Hopeless 0 0 0 0 0  PHQ - 2 Score 0 0 0 0 0  Altered sleeping - 0 - - -  Tired, decreased energy - 0 - - -  Change in appetite - 0 - - -  Feeling bad or failure about yourself  - 0 - - -  Trouble concentrating - 0 - - -  Moving slowly or fidgety/restless - 0 - - -  Suicidal thoughts - 0 - - -  PHQ-9 Score - 0 - - -  Difficult doing work/chores - Not difficult at all - - -    The patient does not have a history of falls. I did not complete a risk assessment for falls. A plan of care for falls was not documented.   Past Medical History:  No past medical history on file.  Surgical History:  Past Surgical History:  Procedure Laterality Date  . HERNIA REPAIR    . HYDROCELE EXCISION / REPAIR    . KNEE CARTILAGE SURGERY Left 2008     Medications:  Current Outpatient Medications on File Prior to Visit  Medication Sig  . sildenafil (REVATIO) 20 MG tablet Take 1-5 tablets prn  . triamcinolone cream (KENALOG) 0.1 % Apply 1 application topically 2 (two) times daily.  . [DISCONTINUED] metoprolol succinate (TOPROL-XL) 25 MG 24 hr tablet Take 1 tablet (25 mg total) by mouth daily.   No current facility-administered medications on file prior to visit.    Allergies:  Allergies  Allergen Reactions  . Benazepril Cough  . Amlodipine Other (See Comments)    Mild edema with 10 mg dose does fine with 5mg     Social History:  Social History   Socioeconomic History  . Marital status: Married    Spouse name: Not on file  . Number of children: Not on file  . Years of education: Not on file  . Highest education level: Not on file  Occupational History  . Not on file  Tobacco Use  . Smoking status: Never Smoker  . Smokeless tobacco: Never Used  Vaping Use  . Vaping Use: Never used  Substance and Sexual Activity  . Alcohol use: No  . Drug use: No  . Sexual activity: Not on file  Other Topics Concern  . Not on file  Social  History Narrative  . Not on file   Social Determinants of Health   Financial Resource Strain:   . Difficulty of Paying Living Expenses: Not on file  Food Insecurity:   . Worried About Programme researcher, broadcasting/film/video in the Last Year: Not on file  . Ran Out of Food in the Last Year: Not on file  Transportation Needs:   . Lack of Transportation (Medical): Not on file  . Lack of Transportation (Non-Medical): Not on file  Physical Activity:   . Days of Exercise per Week: Not on file  . Minutes of Exercise per Session: Not on file  Stress:   . Feeling of Stress : Not on file  Social Connections:   . Frequency of Communication with Friends and Family: Not on file  . Frequency of Social Gatherings with Friends and Family: Not on file  . Attends Religious Services: Not on file  . Active Member of Clubs  or Organizations: Not on file  . Attends Banker Meetings: Not on file  . Marital Status: Not on file  Intimate Partner Violence:   . Fear of Current or Ex-Partner: Not on file  . Emotionally Abused: Not on file  . Physically Abused: Not on file  . Sexually Abused: Not on file   Social History   Tobacco Use  Smoking Status Never Smoker  Smokeless Tobacco Never Used   Social History   Substance and Sexual Activity  Alcohol Use No    Family History:  Family History  Problem Relation Age of Onset  . Cancer Mother        ovarian  . Hypertension Mother   . Hypertension Father   . Stroke Father   . Heart disease Father   . Cancer Father        prostate    Past medical history, surgical history, medications, allergies, family history and social history reviewed with patient today and changes made to appropriate areas of the chart.   Review of Systems  Constitutional: Negative.  Negative for diaphoresis, fever and weight loss.  HENT: Negative.  Negative for ear pain, hearing loss, sinus pain and sore throat.   Eyes: Negative.  Negative for blurred vision, double vision, discharge and redness.  Respiratory: Negative.  Negative for cough, sputum production, shortness of breath and wheezing.   Cardiovascular: Negative.  Negative for chest pain, palpitations and leg swelling.  Gastrointestinal: Negative.  Negative for abdominal pain, blood in stool, diarrhea, nausea and vomiting.  Genitourinary: Negative.  Negative for dysuria, frequency and urgency.  Musculoskeletal: Negative.  Negative for back pain and myalgias.  Skin: Negative.  Negative for itching and rash.  Neurological: Negative.  Negative for dizziness, tingling, weakness and headaches.  Psychiatric/Behavioral: Negative.  Negative for depression and suicidal ideas. The patient is not nervous/anxious and does not have insomnia.    All other ROS negative except what is listed above and in the HPI.       Objective:    BP (!) 151/101 (BP Location: Left Arm, Patient Position: Sitting, Cuff Size: Large)   Pulse 72   Temp 97.9 F (36.6 C) (Oral)   Resp 16   Ht 5' 10.5" (1.791 m)   Wt 264 lb 3.2 oz (119.8 kg)   SpO2 97%   BMI 37.37 kg/m   Wt Readings from Last 3 Encounters:  07/10/20 264 lb 3.2 oz (119.8 kg)  11/15/19 250 lb (113.4 kg)  07/28/19 250 lb (113.4 kg)    Physical Exam  Constitutional:      Appearance: Normal appearance. He is normal weight.  HENT:     Head: Normocephalic.     Right Ear: Tympanic membrane, ear canal and external ear normal.     Left Ear: Tympanic membrane, ear canal and external ear normal.     Nose: Nose normal. No congestion.     Mouth/Throat:     Mouth: Mucous membranes are moist.     Pharynx: Oropharynx is clear. No oropharyngeal exudate or posterior oropharyngeal erythema.  Eyes:     General: No scleral icterus.    Extraocular Movements: Extraocular movements intact.     Pupils: Pupils are equal, round, and reactive to light.  Cardiovascular:     Rate and Rhythm: Normal rate and regular rhythm.     Heart sounds: Normal heart sounds. No murmur heard.  No gallop.   Pulmonary:     Effort: Pulmonary effort is normal. No respiratory distress.     Breath sounds: Normal breath sounds. No wheezing or rhonchi.  Abdominal:     General: Abdomen is flat. Bowel sounds are normal. There is no distension.     Palpations: Abdomen is soft.     Tenderness: There is no abdominal tenderness.  Genitourinary:    Comments: Deferred using shared decision making Musculoskeletal:        General: No swelling or tenderness. Normal range of motion.     Cervical back: Normal range of motion and neck supple. No tenderness.     Right lower leg: Edema (trace) present.     Left lower leg: Edema (trace) present.  Skin:    General: Skin is warm and dry.     Coloration: Skin is not jaundiced.     Findings: No lesion.  Neurological:     General: No focal deficit present.      Mental Status: He is alert and oriented to person, place, and time. Mental status is at baseline.  Psychiatric:        Mood and Affect: Mood normal.        Behavior: Behavior normal.        Thought Content: Thought content normal.        Judgment: Judgment normal.        Assessment & Plan:   Problem List Items Addressed This Visit      Cardiovascular and Mediastinum   Essential hypertension    Chronic, uncontrolled today - patient has been out of medication for about 10 days.  Will restart all medications - amlodipine 5 mg daily, losartan 100 mg daily, and hctz 25 mg daily sent into pharmacy.  Encouraged patient to check BP a few times per week at home after resuming medications and let us know if BP stays >130/80.  CMP, CBC, TSH, urine microalbumin checked today.  Continue DASH diet and follow up in 6 months or sooner if BP remains elevated.      Relevant Medications   amLODipine (NORVASC) 5 MG tablet   hydrochlorothiazide (HYDRODIURIL) 25 MG tablet   losartan (COZAAR) 100 MG tablet   Other Relevant Orders   CBC with Differential/Platelet   Comprehensive metabolic panel   Microalbumin, Urine Waived     Other   Obesity    Chronic, ongoing.  Patient also reports significant amount of stress with job and home life.  Will check lipids, Hgba1c for diabetes screening.  Patient is fasting today.  Encouraged increasing exercise with goal of 30 minutes 5 times weekly.  Continue DASH diet,  handout given.       Relevant Orders   HgB A1c    Other Visit Diagnoses    Annual physical exam    -  Primary   Encounter for lipid screening for cardiovascular disease       Relevant Orders   Lipid Panel w/o Chol/HDL Ratio   Encounter for hepatitis C screening test for low risk patient       Relevant Orders   Hepatitis C antibody   Encounter for screening for HIV       Relevant Orders   HIV Antibody (routine testing w rflx)   Screening for thyroid disorder       Relevant Orders   TSH    Screening for prostate cancer       Relevant Orders   PSA   Colon cancer screening       Relevant Orders   Ambulatory referral to Gastroenterology       Discussed aspirin prophylaxis for myocardial infarction prevention and decision was it was not indicated  LABORATORY TESTING:  Health maintenance labs ordered today as discussed above.   The natural history of prostate cancer and ongoing controversy regarding screening and potential treatment outcomes of prostate cancer has been discussed with the patient. The meaning of a false positive PSA and a false negative PSA has been discussed. He indicates understanding of the limitations of this screening test and wishes to proceed with screening PSA testing.   IMMUNIZATIONS:   - Tdap: Tetanus vaccination status reviewed: last tetanus booster within 10 years. - Influenza: Refused - Pneumovax: Not applicable - Prevnar: Not applicable - HPV: Not applicable - Zostavax vaccine: Not applicable  - COVID-19 vaccine: vaccinated in March, April 2021 with Pfizer vaccine.  SCREENING: - Colonoscopy: Ordered today  Discussed with patient purpose of the colonoscopy is to detect colon cancer at curable precancerous or early stages   - AAA Screening: Not applicable  -Hearing Test: Not applicable  -Spirometry: Not applicable   PATIENT COUNSELING:    Sexuality: Discussed sexually transmitted diseases, partner selection, use of condoms, avoidance of unintended pregnancy  and contraceptive alternatives.   Advised to avoid cigarette smoking.  I discussed with the patient that most people either abstain from alcohol or drink within safe limits (<=14/week and <=4 drinks/occasion for males, <=7/weeks and <= 3 drinks/occasion for females) and that the risk for alcohol disorders and other health effects rises proportionally with the number of drinks per week and how often a drinker exceeds daily limits.  Discussed cessation/primary prevention of drug use  and availability of treatment for abuse.   Diet: Encouraged to adjust caloric intake to maintain  or achieve ideal body weight, to reduce intake of dietary saturated fat and total fat, to limit sodium intake by avoiding high sodium foods and not adding table salt, and to maintain adequate dietary potassium and calcium preferably from fresh fruits, vegetables, and low-fat dairy products.    stressed the importance of regular exercise  Injury prevention: Discussed safety belts, safety helmets, smoke detector, smoking near bedding or upholstery.   Dental health: Discussed importance of regular tooth brushing, flossing, and dental visits.   Follow up plan: NEXT PREVENTATIVE PHYSICAL DUE IN 1 YEAR. Return in about 6 months (around 01/07/2021) for blood pressure f/u.

## 2020-07-10 ENCOUNTER — Other Ambulatory Visit: Payer: Self-pay

## 2020-07-10 ENCOUNTER — Ambulatory Visit (INDEPENDENT_AMBULATORY_CARE_PROVIDER_SITE_OTHER): Payer: 59 | Admitting: Nurse Practitioner

## 2020-07-10 ENCOUNTER — Encounter: Payer: Self-pay | Admitting: Nurse Practitioner

## 2020-07-10 ENCOUNTER — Encounter: Payer: 59 | Admitting: Family Medicine

## 2020-07-10 VITALS — BP 151/101 | HR 72 | Temp 97.9°F | Resp 16 | Ht 70.5 in | Wt 264.2 lb

## 2020-07-10 DIAGNOSIS — Z125 Encounter for screening for malignant neoplasm of prostate: Secondary | ICD-10-CM

## 2020-07-10 DIAGNOSIS — Z6835 Body mass index (BMI) 35.0-35.9, adult: Secondary | ICD-10-CM

## 2020-07-10 DIAGNOSIS — I10 Essential (primary) hypertension: Secondary | ICD-10-CM

## 2020-07-10 DIAGNOSIS — Z136 Encounter for screening for cardiovascular disorders: Secondary | ICD-10-CM

## 2020-07-10 DIAGNOSIS — Z1329 Encounter for screening for other suspected endocrine disorder: Secondary | ICD-10-CM

## 2020-07-10 DIAGNOSIS — Z1159 Encounter for screening for other viral diseases: Secondary | ICD-10-CM

## 2020-07-10 DIAGNOSIS — Z0000 Encounter for general adult medical examination without abnormal findings: Secondary | ICD-10-CM

## 2020-07-10 DIAGNOSIS — Z13220 Encounter for screening for lipoid disorders: Secondary | ICD-10-CM

## 2020-07-10 DIAGNOSIS — Z114 Encounter for screening for human immunodeficiency virus [HIV]: Secondary | ICD-10-CM | POA: Diagnosis not present

## 2020-07-10 DIAGNOSIS — Z1211 Encounter for screening for malignant neoplasm of colon: Secondary | ICD-10-CM

## 2020-07-10 DIAGNOSIS — E66812 Obesity, class 2: Secondary | ICD-10-CM

## 2020-07-10 LAB — MICROALBUMIN, URINE WAIVED
Creatinine, Urine Waived: 50 mg/dL (ref 10–300)
Microalb, Ur Waived: 10 mg/L (ref 0–19)
Microalb/Creat Ratio: <30 mg/g (ref <30)

## 2020-07-10 MED ORDER — AMLODIPINE BESYLATE 5 MG PO TABS: 5.0000 mg | ORAL_TABLET | Freq: Every day | ORAL | 1 refills | Status: DC

## 2020-07-10 MED ORDER — LOSARTAN POTASSIUM 100 MG PO TABS: 100.0000 mg | ORAL_TABLET | Freq: Every day | ORAL | 1 refills | Status: DC

## 2020-07-10 MED ORDER — HYDROCHLOROTHIAZIDE 25 MG PO TABS: 25.0000 mg | ORAL_TABLET | Freq: Every day | ORAL | 1 refills | Status: DC

## 2020-07-10 NOTE — Assessment & Plan Note (Addendum)
Chronic, uncontrolled today - patient has been out of medication for about 10 days.  Will restart all medications - amlodipine 5 mg daily, losartan 100 mg daily, and hctz 25 mg daily sent into pharmacy.  Encouraged patient to check BP a few times per week at home after resuming medications and let us know if BP stays >130/80.  CMP, CBC, TSH, urine microalbumin checked today.  Continue DASH diet and follow up in 6 months or sooner if BP remains elevated.

## 2020-07-10 NOTE — Patient Instructions (Addendum)
Isaiah Terrell, Be sure to start checking your blood pressure at home and if it is staying higher than 130/80 let us know so we can make an appointment to make some medication adjustments.   We will let you know about your lab results tomorrow.   Refills have been sent to your pharmacy.  Be sure to let us know when you pick up your last refill so we can make sure you do not run out.  You will hear from our referral team to schedule your colonoscopy.   DASH Eating Plan DASH stands for "Dietary Approaches to Stop Hypertension." The DASH eating plan is a healthy eating plan that has been shown to reduce high blood pressure (hypertension). It may also reduce your risk for type 2 diabetes, heart disease, and stroke. The DASH eating plan may also help with weight loss. What are tips for following this plan?  General guidelines  Avoid eating more than 2,300 mg (milligrams) of salt (sodium) a day. If you have hypertension, you may need to reduce your sodium intake to 1,500 mg a day.  Limit alcohol intake to no more than 1 drink a day for nonpregnant women and 2 drinks a day for men. One drink equals 12 oz of beer, 5 oz of wine, or 1 oz of hard liquor.  Work with your health care provider to maintain a healthy body weight or to lose weight. Ask what an ideal weight is for you.  Get at least 30 minutes of exercise that causes your heart to beat faster (aerobic exercise) most days of the week. Activities may include walking, swimming, or biking.  Work with your health care provider or diet and nutrition specialist (dietitian) to adjust your eating plan to your individual calorie needs. Reading food labels   Check food labels for the amount of sodium per serving. Choose foods with less than 5 percent of the Daily Value of sodium. Generally, foods with less than 300 mg of sodium per serving fit into this eating plan.  To find whole grains, look for the word "whole" as the first word in the ingredient  list. Shopping  Buy products labeled as "low-sodium" or "no salt added."  Buy fresh foods. Avoid canned foods and premade or frozen meals. Cooking  Avoid adding salt when cooking. Use salt-free seasonings or herbs instead of table salt or sea salt. Check with your health care provider or pharmacist before using salt substitutes.  Do not fry foods. Cook foods using healthy methods such as baking, boiling, grilling, and broiling instead.  Cook with heart-healthy oils, such as olive, canola, soybean, or sunflower oil. Meal planning  Eat a balanced diet that includes: ? 5 or more servings of fruits and vegetables each day. At each meal, try to fill half of your plate with fruits and vegetables. ? Up to 6-8 servings of whole grains each day. ? Less than 6 oz of lean meat, poultry, or fish each day. A 3-oz serving of meat is about the same size as a deck of cards. One egg equals 1 oz. ? 2 servings of low-fat dairy each day. ? A serving of nuts, seeds, or beans 5 times each week. ? Heart-healthy fats. Healthy fats called Omega-3 fatty acids are found in foods such as flaxseeds and coldwater fish, like sardines, salmon, and mackerel.  Limit how much you eat of the following: ? Canned or prepackaged foods. ? Food that is high in trans fat, such as fried foods. ? Food that  is high in saturated fat, such as fatty meat. ? Sweets, desserts, sugary drinks, and other foods with added sugar. ? Full-fat dairy products.  Do not salt foods before eating.  Try to eat at least 2 vegetarian meals each week.  Eat more home-cooked food and less restaurant, buffet, and fast food.  When eating at a restaurant, ask that your food be prepared with less salt or no salt, if possible. What foods are recommended? The items listed may not be a complete list. Talk with your dietitian about what dietary choices are best for you. Grains Whole-grain or whole-wheat bread. Whole-grain or whole-wheat pasta. Brown  rice. Oatmeal. Quinoa. Bulgur. Whole-grain and low-sodium cereals. Pita bread. Low-fat, low-sodium crackers. Whole-wheat flour tortillas. Vegetables Fresh or frozen vegetables (raw, steamed, roasted, or grilled). Low-sodium or reduced-sodium tomato and vegetable juice. Low-sodium or reduced-sodium tomato sauce and tomato paste. Low-sodium or reduced-sodium canned vegetables. Fruits All fresh, dried, or frozen fruit. Canned fruit in natural juice (without added sugar). Meat and other protein foods Skinless chicken or turkey. Ground chicken or turkey. Pork with fat trimmed off. Fish and seafood. Egg whites. Dried beans, peas, or lentils. Unsalted nuts, nut butters, and seeds. Unsalted canned beans. Lean cuts of beef with fat trimmed off. Low-sodium, lean deli meat. Dairy Low-fat (1%) or fat-free (skim) milk. Fat-free, low-fat, or reduced-fat cheeses. Nonfat, low-sodium ricotta or cottage cheese. Low-fat or nonfat yogurt. Low-fat, low-sodium cheese. Fats and oils Soft margarine without trans fats. Vegetable oil. Low-fat, reduced-fat, or light mayonnaise and salad dressings (reduced-sodium). Canola, safflower, olive, soybean, and sunflower oils. Avocado. Seasoning and other foods Herbs. Spices. Seasoning mixes without salt. Unsalted popcorn and pretzels. Fat-free sweets. What foods are not recommended? The items listed may not be a complete list. Talk with your dietitian about what dietary choices are best for you. Grains Baked goods made with fat, such as croissants, muffins, or some breads. Dry pasta or rice meal packs. Vegetables Creamed or fried vegetables. Vegetables in a cheese sauce. Regular canned vegetables (not low-sodium or reduced-sodium). Regular canned tomato sauce and paste (not low-sodium or reduced-sodium). Regular tomato and vegetable juice (not low-sodium or reduced-sodium). Pickles. Olives. Fruits Canned fruit in a light or heavy syrup. Fried fruit. Fruit in cream or butter  sauce. Meat and other protein foods Fatty cuts of meat. Ribs. Fried meat. Bacon. Sausage. Bologna and other processed lunch meats. Salami. Fatback. Hotdogs. Bratwurst. Salted nuts and seeds. Canned beans with added salt. Canned or smoked fish. Whole eggs or egg yolks. Chicken or turkey with skin. Dairy Whole or 2% milk, cream, and half-and-half. Whole or full-fat cream cheese. Whole-fat or sweetened yogurt. Full-fat cheese. Nondairy creamers. Whipped toppings. Processed cheese and cheese spreads. Fats and oils Butter. Stick margarine. Lard. Shortening. Ghee. Bacon fat. Tropical oils, such as coconut, palm kernel, or palm oil. Seasoning and other foods Salted popcorn and pretzels. Onion salt, garlic salt, seasoned salt, table salt, and sea salt. Worcestershire sauce. Tartar sauce. Barbecue sauce. Teriyaki sauce. Soy sauce, including reduced-sodium. Steak sauce. Canned and packaged gravies. Fish sauce. Oyster sauce. Cocktail sauce. Horseradish that you find on the shelf. Ketchup. Mustard. Meat flavorings and tenderizers. Bouillon cubes. Hot sauce and Tabasco sauce. Premade or packaged marinades. Premade or packaged taco seasonings. Relishes. Regular salad dressings. Where to find more information:  National Heart, Lung, and Blood Institute: www.nhlbi.nih.gov  American Heart Association: www.heart.org Summary  The DASH eating plan is a healthy eating plan that has been shown to reduce high blood pressure (hypertension).   It may also reduce your risk for type 2 diabetes, heart disease, and stroke.  With the DASH eating plan, you should limit salt (sodium) intake to 2,300 mg a day. If you have hypertension, you may need to reduce your sodium intake to 1,500 mg a day.  When on the DASH eating plan, aim to eat more fresh fruits and vegetables, whole grains, lean proteins, low-fat dairy, and heart-healthy fats.  Work with your health care provider or diet and nutrition specialist (dietitian) to adjust  your eating plan to your individual calorie needs. This information is not intended to replace advice given to you by your health care provider. Make sure you discuss any questions you have with your health care provider. Document Revised: 09/12/2017 Document Reviewed: 09/23/2016 Elsevier Patient Education  2020 Elsevier Inc.   Preventive Care 40-64 Years Old, Male Preventive care refers to lifestyle choices and visits with your health care provider that can promote health and wellness. This includes:  A yearly physical exam. This is also called an annual well check.  Regular dental and eye exams.  Immunizations.  Screening for certain conditions.  Healthy lifestyle choices, such as eating a healthy diet, getting regular exercise, not using drugs or products that contain nicotine and tobacco, and limiting alcohol use. What can I expect for my preventive care visit? Physical exam Your health care provider will check:  Height and weight. These may be used to calculate body mass index (BMI), which is a measurement that tells if you are at a healthy weight.  Heart rate and blood pressure.  Your skin for abnormal spots. Counseling Your health care provider may ask you questions about:  Alcohol, tobacco, and drug use.  Emotional well-being.  Home and relationship well-being.  Sexual activity.  Eating habits.  Work and work environment. What immunizations do I need?  Influenza (flu) vaccine  This is recommended every year. Tetanus, diphtheria, and pertussis (Tdap) vaccine  You may need a Td booster every 10 years. Varicella (chickenpox) vaccine  You may need this vaccine if you have not already been vaccinated. Zoster (shingles) vaccine  You may need this after age 60. Measles, mumps, and rubella (MMR) vaccine  You may need at least one dose of MMR if you were born in 1957 or later. You may also need a second dose. Pneumococcal conjugate (PCV13) vaccine  You may  need this if you have certain conditions and were not previously vaccinated. Pneumococcal polysaccharide (PPSV23) vaccine  You may need one or two doses if you smoke cigarettes or if you have certain conditions. Meningococcal conjugate (MenACWY) vaccine  You may need this if you have certain conditions. Hepatitis A vaccine  You may need this if you have certain conditions or if you travel or work in places where you may be exposed to hepatitis A. Hepatitis B vaccine  You may need this if you have certain conditions or if you travel or work in places where you may be exposed to hepatitis B. Haemophilus influenzae type b (Hib) vaccine  You may need this if you have certain risk factors. Human papillomavirus (HPV) vaccine  If recommended by your health care provider, you may need three doses over 6 months. You may receive vaccines as individual doses or as more than one vaccine together in one shot (combination vaccines). Talk with your health care provider about the risks and benefits of combination vaccines. What tests do I need? Blood tests  Lipid and cholesterol levels. These may be checked   every 5 years, or more frequently if you are over 84 years old.  Hepatitis C test.  Hepatitis B test. Screening  Lung cancer screening. You may have this screening every year starting at age 13 if you have a 30-pack-year history of smoking and currently smoke or have quit within the past 15 years.  Prostate cancer screening. Recommendations will vary depending on your family history and other risks.  Colorectal cancer screening. All adults should have this screening starting at age 61 and continuing until age 53. Your health care provider may recommend screening at age 13 if you are at increased risk. You will have tests every 1-10 years, depending on your results and the type of screening test.  Diabetes screening. This is done by checking your blood sugar (glucose) after you have not eaten  for a while (fasting). You may have this done every 1-3 years.  Sexually transmitted disease (STD) testing. Follow these instructions at home: Eating and drinking  Eat a diet that includes fresh fruits and vegetables, whole grains, lean protein, and low-fat dairy products.  Take vitamin and mineral supplements as recommended by your health care provider.  Do not drink alcohol if your health care provider tells you not to drink.  If you drink alcohol: ? Limit how much you have to 0-2 drinks a day. ? Be aware of how much alcohol is in your drink. In the [place] one drink equals one 12 oz bottle of beer (355 mL), one 5 oz glass of wine (148 mL), or one 1 oz glass of hard liquor (44 mL). Lifestyle  Take daily care of your teeth and gums.  Stay active. Exercise for at least 30 minutes on 5 or more days each week.  Do not use any products that contain nicotine or tobacco, such as cigarettes, e-cigarettes, and chewing tobacco. If you need help quitting, ask your health care provider.  If you are sexually active, practice safe sex. Use a condom or other form of protection to prevent STIs (sexually transmitted infections).  Talk with your health care provider about taking a low-dose aspirin every day starting at age 29. What's next?  Go to your health care provider once a year for a well check visit.  Ask your health care provider how often you should have your eyes and teeth checked.  Stay up to date on all vaccines. This information is not intended to replace advice given to you by your health care provider. Make sure you discuss any questions you have with your health care provider. Document Revised: 09/24/2018 Document Reviewed: 09/24/2018 Elsevier Patient Education  2020 [employer].

## 2020-07-10 NOTE — Assessment & Plan Note (Signed)
Chronic, ongoing.  Patient also reports significant amount of stress with job and home life.  Will check lipids, Hgba1c for diabetes screening.  Patient is fasting today.  Encouraged increasing exercise with goal of 30 minutes 5 times weekly.  Continue DASH diet, handout given.

## 2020-07-11 LAB — PSA: Prostate Specific Ag, Serum: 1.6 ng/mL (ref 0.0–4.0)

## 2020-07-11 LAB — CBC WITH DIFFERENTIAL/PLATELET
Basophils Absolute: 0.1 10*3/uL (ref 0.0–0.2)
Basos: 1 %
EOS (ABSOLUTE): 0.1 10*3/uL (ref 0.0–0.4)
Eos: 2 %
Hematocrit: 48 % (ref 37.5–51.0)
Hemoglobin: 16.6 g/dL (ref 13.0–17.7)
Immature Grans (Abs): 0 10*3/uL (ref 0.0–0.1)
Immature Granulocytes: 1 %
Lymphocytes Absolute: 1.4 10*3/uL (ref 0.7–3.1)
Lymphs: 21 %
MCH: 30.6 pg (ref 26.6–33.0)
MCHC: 34.6 g/dL (ref 31.5–35.7)
MCV: 88 fL (ref 79–97)
Monocytes Absolute: 0.5 10*3/uL (ref 0.1–0.9)
Monocytes: 7 %
Neutrophils Absolute: 4.5 10*3/uL (ref 1.4–7.0)
Neutrophils: 68 %
Platelets: 268 10*3/uL (ref 150–450)
RBC: 5.43 x10E6/uL (ref 4.14–5.80)
RDW: 13.8 % (ref 11.6–15.4)
WBC: 6.6 10*3/uL (ref 3.4–10.8)

## 2020-07-11 LAB — COMPREHENSIVE METABOLIC PANEL
ALT: 46 IU/L — ABNORMAL HIGH (ref 0–44)
AST: 27 IU/L (ref 0–40)
Albumin/Globulin Ratio: 1.9 (ref 1.2–2.2)
Albumin: 4.4 g/dL (ref 4.0–5.0)
Alkaline Phosphatase: 70 IU/L (ref 44–121)
BUN/Creatinine Ratio: 14 (ref 9–20)
BUN: 14 mg/dL (ref 6–24)
Bilirubin Total: 0.3 mg/dL (ref 0.0–1.2)
CO2: 21 mmol/L (ref 20–29)
Calcium: 10 mg/dL (ref 8.7–10.2)
Chloride: 102 mmol/L (ref 96–106)
Creatinine, Ser: 0.99 mg/dL (ref 0.76–1.27)
GFR calc Af Amer: 102 mL/min/{1.73_m2} (ref >59)
GFR calc non Af Amer: 88 mL/min/{1.73_m2} (ref >59)
Globulin, Total: 2.3 g/dL (ref 1.5–4.5)
Glucose: 109 mg/dL — ABNORMAL HIGH (ref 65–99)
Potassium: 5.3 mmol/L — ABNORMAL HIGH (ref 3.5–5.2)
Sodium: 137 mmol/L (ref 134–144)
Total Protein: 6.7 g/dL (ref 6.0–8.5)

## 2020-07-11 LAB — HEPATITIS C ANTIBODY: Hep C Virus Ab: <0.1 s/co ratio (ref 0.0–0.9)

## 2020-07-11 LAB — TSH: TSH: 5.77 u[IU]/mL — ABNORMAL HIGH (ref 0.450–4.500)

## 2020-07-11 LAB — HEMOGLOBIN A1C
Est. average glucose Bld gHb Est-mCnc: 123 mg/dL
Hgb A1c MFr Bld: 5.9 % — ABNORMAL HIGH (ref 4.8–5.6)

## 2020-07-11 LAB — HIV ANTIBODY (ROUTINE TESTING W REFLEX): HIV Screen 4th Generation wRfx: NONREACTIVE

## 2020-07-11 LAB — LIPID PANEL W/O CHOL/HDL RATIO
Cholesterol, Total: 176 mg/dL (ref 100–199)
HDL: 27 mg/dL — ABNORMAL LOW (ref >39)
LDL Chol Calc (NIH): 104 mg/dL — ABNORMAL HIGH (ref 0–99)
Triglycerides: 258 mg/dL — ABNORMAL HIGH (ref 0–149)
VLDL Cholesterol Cal: 45 mg/dL — ABNORMAL HIGH (ref 5–40)

## 2020-07-11 NOTE — Addendum Note (Signed)
Addended by: Cathlean Marseilles A on: 07/11/2020 05:25 PM   Modules accepted: Orders

## 2020-07-14 LAB — SPECIMEN STATUS REPORT

## 2020-07-14 LAB — THYROID PANEL WITH TSH
Free Thyroxine Index: 2.2 (ref 1.2–4.9)
T3 Uptake Ratio: 23 % — ABNORMAL LOW (ref 24–39)
T4, Total: 9.4 ug/dL (ref 4.5–12.0)
TSH: 6.26 u[IU]/mL — ABNORMAL HIGH (ref 0.450–4.500)

## 2020-10-10 ENCOUNTER — Other Ambulatory Visit: Payer: Self-pay | Admitting: Family Medicine

## 2020-10-10 DIAGNOSIS — I10 Essential (primary) hypertension: Secondary | ICD-10-CM

## 2020-10-10 MED ORDER — HYDROCHLOROTHIAZIDE 25 MG PO TABS: 25.0000 mg | ORAL_TABLET | Freq: Every day | ORAL | 0 refills | Status: DC

## 2020-10-10 MED ORDER — AMLODIPINE BESYLATE 5 MG PO TABS: 5.0000 mg | ORAL_TABLET | Freq: Every day | ORAL | 0 refills | Status: DC

## 2020-10-10 NOTE — Telephone Encounter (Signed)
Medication: losartan (COZAAR) 100 MG tablet [[id number]] , hydrochlorothiazide (HYDRODIURIL) 25 MG tablet [[id number]] , amLODipine (NORVASC) 5 MG tablet [[id number]]   Has the patient contacted their pharmacy? YES  (Agent: If no, request that the patient contact the pharmacy for the refill.) (Agent: If yes, when and what did the pharmacy advise?)  Preferred Pharmacy (with phone number or street name): [facility] - [place], [place] - [address]  [address], [place] [address]  Phone:  [phone] Fax:  [phone]   Agent: Please be advised that RX refills may take up to 3 business days. We ask that you follow-up with your pharmacy.

## 2021-01-03 ENCOUNTER — Encounter: Payer: Self-pay | Admitting: Gastroenterology

## 2021-01-05 DIAGNOSIS — E038 Other specified hypothyroidism: Secondary | ICD-10-CM | POA: Insufficient documentation

## 2021-01-05 DIAGNOSIS — R7303 Prediabetes: Secondary | ICD-10-CM | POA: Insufficient documentation

## 2021-01-05 DIAGNOSIS — R7301 Impaired fasting glucose: Secondary | ICD-10-CM | POA: Insufficient documentation

## 2021-01-05 DIAGNOSIS — E785 Hyperlipidemia, unspecified: Secondary | ICD-10-CM | POA: Insufficient documentation

## 2021-01-05 NOTE — Progress Notes (Signed)
BP (!) 152/88   Pulse 99   Temp 98.8 F (37.1 C)   Wt 264 lb 8 oz (120 kg)   SpO2 98%   BMI 37.42 kg/m    Subjective:    Patient ID: Isaiah Terrell, male    DOB: [birth year], 50 y.o.   MRN: [id number]  HPI: Isaiah Terrell is a 50 y.o. male  Chief Complaint  Patient presents with  . Hypertension   HYPERTENSION / HYPERLIPIDEMIA Satisfied with current treatment? no Duration of hypertension: years BP monitoring frequency: rarely BP range: 140-150/80 BP medication side effects: no Past BP meds: amlodipine, HCTZ and losartan (cozaar) Duration of hyperlipidemia: years Cholesterol medication side effects: no Cholesterol supplements: none Past cholesterol medications: none Medication compliance: none Aspirin: no Recent stressors: no Recurrent headaches: no Visual changes: no Palpitations: no Dyspnea: no Chest pain: no Lower extremity edema: no Dizzy/lightheaded: no  The 10-year ASCVD risk score Denman George DC Jr., et al., 2013) is: 9.4%   Values used to calculate the score:     Age: 41 years     Sex: Male     Is Non-Hispanic African American: No     Diabetic: No     Tobacco smoker: No     Systolic Blood Pressure: 152 mmHg     Is BP treated: Yes     HDL Cholesterol: 27 mg/dL     Total Cholesterol: 176 mg/dL  Relevant past medical, surgical, family and social history reviewed and updated as indicated. Interim medical history since our last visit reviewed. Allergies and medications reviewed and updated.  Review of Systems  Eyes: Negative for visual disturbance.  Respiratory: Negative for chest tightness and shortness of breath.   Cardiovascular: Negative for chest pain, palpitations and leg swelling.  Neurological: Negative for dizziness, light-headedness and headaches.    Per HPI unless specifically indicated above     Objective:    BP (!) 152/88   Pulse 99   Temp 98.8 F (37.1 C)   Wt 264 lb 8 oz (120 kg)   SpO2 98%   BMI 37.42 kg/m   Wt Readings from Last 3  Encounters:  01/08/21 264 lb 8 oz (120 kg)  07/10/20 264 lb 3.2 oz (119.8 kg)  11/15/19 250 lb (113.4 kg)    Physical Exam Vitals and nursing note reviewed.  Constitutional:      General: He is not in acute distress.    Appearance: Normal appearance. He is not ill-appearing, toxic-appearing or diaphoretic.  HENT:     Head: Normocephalic.     Right Ear: External ear normal.     Left Ear: External ear normal.     Nose: Nose normal. No congestion or rhinorrhea.     Mouth/Throat:     Mouth: Mucous membranes are moist.  Eyes:     General:        Right eye: No discharge.        Left eye: No discharge.     Extraocular Movements: Extraocular movements intact.     Conjunctiva/sclera: Conjunctivae normal.     Pupils: Pupils are equal, round, and reactive to light.  Cardiovascular:     Rate and Rhythm: Normal rate and regular rhythm.     Heart sounds: No murmur heard.   Pulmonary:     Effort: Pulmonary effort is normal. No respiratory distress.     Breath sounds: Normal breath sounds. No wheezing, rhonchi or rales.  Abdominal:     General: Abdomen is flat. Bowel sounds are  normal.  Musculoskeletal:     Cervical back: Normal range of motion and neck supple.  Skin:    General: Skin is warm and dry.     Capillary Refill: Capillary refill takes less than 2 seconds.  Neurological:     General: No focal deficit present.     Mental Status: He is alert and oriented to person, place, and time.  Psychiatric:        Mood and Affect: Mood normal.        Behavior: Behavior normal.        Thought Content: Thought content normal.        Judgment: Judgment normal.     Results for orders placed or performed in visit on 07/10/20  TSH  Result Value Ref Range   TSH 5.770 (H) 0.450 - 4.500 uIU/mL  Lipid Panel w/o Chol/HDL Ratio  Result Value Ref Range   Cholesterol, Total 176 100 - 199 mg/dL   Triglycerides 466 (H) 0 - 149 mg/dL   HDL 27 (L) >59 mg/dL   VLDL Cholesterol Cal 45 (H) 5 - 40  mg/dL   LDL Chol Calc (NIH) 935 (H) 0 - 99 mg/dL  PSA  Result Value Ref Range   Prostate Specific Ag, Serum 1.6 0.0 - 4.0 ng/mL  CBC with Differential/Platelet  Result Value Ref Range   WBC 6.6 3.4 - 10.8 x10E3/uL   RBC 5.43 4.14 - 5.80 x10E6/uL   Hemoglobin 16.6 13.0 - 17.7 g/dL   Hematocrit 70.1 77.9 - 51.0 %   MCV 88 79 - 97 fL   MCH 30.6 26.6 - 33.0 pg   MCHC 34.6 31.5 - 35.7 g/dL   RDW 39.0 30.0 - 92.3 %   Platelets 268 150 - 450 x10E3/uL   Neutrophils 68 Not Estab. %   Lymphs 21 Not Estab. %   Monocytes 7 Not Estab. %   Eos 2 Not Estab. %   Basos 1 Not Estab. %   Neutrophils Absolute 4.5 1.4 - 7.0 x10E3/uL   Lymphocytes Absolute 1.4 0.7 - 3.1 x10E3/uL   Monocytes Absolute 0.5 0.1 - 0.9 x10E3/uL   EOS (ABSOLUTE) 0.1 0.0 - 0.4 x10E3/uL   Basophils Absolute 0.1 0.0 - 0.2 x10E3/uL   Immature Granulocytes 1 Not Estab. %   Immature Grans (Abs) 0.0 0.0 - 0.1 x10E3/uL  Comprehensive metabolic panel  Result Value Ref Range   Glucose 109 (H) 65 - 99 mg/dL   BUN 14 6 - 24 mg/dL   Creatinine, Ser 3.00 0.76 - 1.27 mg/dL   GFR calc non Af Amer 88 >59 mL/min/1.73   GFR calc Af Amer 102 >59 mL/min/1.73   BUN/Creatinine Ratio 14 9 - 20   Sodium 137 134 - 144 mmol/L   Potassium 5.3 (H) 3.5 - 5.2 mmol/L   Chloride 102 96 - 106 mmol/L   CO2 21 20 - 29 mmol/L   Calcium 10.0 8.7 - 10.2 mg/dL   Total Protein 6.7 6.0 - 8.5 g/dL   Albumin 4.4 4.0 - 5.0 g/dL   Globulin, Total 2.3 1.5 - 4.5 g/dL   Albumin/Globulin Ratio 1.9 1.2 - 2.2   Bilirubin Total 0.3 0.0 - 1.2 mg/dL   Alkaline Phosphatase 70 44 - 121 IU/L   AST 27 0 - 40 IU/L   ALT 46 (H) 0 - 44 IU/L  Hepatitis C antibody  Result Value Ref Range   Hep C Virus Ab <0.1 0.0 - 0.9 s/co ratio  HIV Antibody (routine testing w rflx)  Result Value Ref Range   HIV Screen 4th Generation wRfx Non Reactive Non Reactive  HgB A1c  Result Value Ref Range   Hgb A1c MFr Bld 5.9 (H) 4.8 - 5.6 %   Est. average glucose Bld gHb Est-mCnc 123  mg/dL  Microalbumin, Urine Waived  Result Value Ref Range   Microalb, Ur Waived 10 0 - 19 mg/L   Creatinine, Urine Waived 50 10 - 300 mg/dL   Microalb/Creat Ratio <30 <30 mg/g  Thyroid Panel With TSH  Result Value Ref Range   TSH 6.260 (H) 0.450 - 4.500 uIU/mL   T4, Total 9.4 4.5 - 12.0 ug/dL   T3 Uptake Ratio 23 (L) 24 - 39 %   Free Thyroxine Index 2.2 1.2 - 4.9  Specimen status report  Result Value Ref Range   specimen status report Comment       Assessment & Plan:   Problem List Items Addressed This Visit      Cardiovascular and Mediastinum   Essential hypertension - Primary    Chronic.  Uncontrolled.  Add metoprolol 25 daily.  Discussed side effects and benefits of medication with patient. Cannot increase amlodipine due to patient having lower extremity swelling with 10mg .  Recommend patient check blood pressure and HR at home.   Labs ordered today.  Followed up in 1 month.      Relevant Medications   metoprolol tartrate (LOPRESSOR) 25 MG tablet   rosuvastatin (CRESTOR) 5 MG tablet   Other Relevant Orders   Basic Metabolic Panel (BMET)     Endocrine   Subclinical hypothyroidism    Chronic.  Controlled.  Labs ordered today.  Will make recommendations based on lab results.      Relevant Medications   metoprolol tartrate (LOPRESSOR) 25 MG tablet   Other Relevant Orders   TSH   T4     Other   Hyperlipidemia    Chronic.  Labs ordered today.  ASCVD risk score is elevated to 9%.  Recommend patient start Crestor 5mg  daily.  Will titrate up as patient tolerates.  Side effects and benefits of medication discussed with patient at visit today. Return in 6 months for recheck.      Relevant Medications   metoprolol tartrate (LOPRESSOR) 25 MG tablet   rosuvastatin (CRESTOR) 5 MG tablet   Other Relevant Orders   Basic Metabolic Panel (BMET)   Lipid Profile   Prediabetes    Chronic.  Discussed lifestyle modifications including diet and exercise.       Relevant Orders    HgB A1c       Follow up plan: Return in about 1 month (around 02/08/2021) for BP Check.

## 2021-01-05 NOTE — Assessment & Plan Note (Addendum)
Chronic.  Uncontrolled.  Add metoprolol 25 daily.  Discussed side effects and benefits of medication with patient. Cannot increase amlodipine due to patient having lower extremity swelling with 10mg .  Recommend patient check blood pressure and HR at home.   Labs ordered today.  Followed up in 1 month.

## 2021-01-05 NOTE — Assessment & Plan Note (Signed)
Chronic.  Discussed lifestyle modifications including diet and exercise.

## 2021-01-05 NOTE — Assessment & Plan Note (Signed)
Chronic.  Labs ordered today.  ASCVD risk score is elevated to 9%.  Recommend patient start Crestor 5mg  daily.  Will titrate up as patient tolerates.  Side effects and benefits of medication discussed with patient at visit today. Return in 6 months for recheck.

## 2021-01-05 NOTE — Assessment & Plan Note (Signed)
Chronic. Controlled. Labs ordered today. Will make recommendations based on lab results.   

## 2021-01-08 ENCOUNTER — Encounter: Payer: Self-pay | Admitting: Nurse Practitioner

## 2021-01-08 ENCOUNTER — Other Ambulatory Visit: Payer: Self-pay

## 2021-01-08 ENCOUNTER — Ambulatory Visit (INDEPENDENT_AMBULATORY_CARE_PROVIDER_SITE_OTHER): Payer: 59 | Admitting: Nurse Practitioner

## 2021-01-08 ENCOUNTER — Ambulatory Visit: Payer: 59 | Admitting: Family Medicine

## 2021-01-08 VITALS — BP 152/88 | HR 99 | Temp 98.8°F | Wt 264.5 lb

## 2021-01-08 DIAGNOSIS — E038 Other specified hypothyroidism: Secondary | ICD-10-CM

## 2021-01-08 DIAGNOSIS — E782 Mixed hyperlipidemia: Secondary | ICD-10-CM

## 2021-01-08 DIAGNOSIS — I10 Essential (primary) hypertension: Secondary | ICD-10-CM

## 2021-01-08 DIAGNOSIS — R7303 Prediabetes: Secondary | ICD-10-CM

## 2021-01-08 MED ORDER — ROSUVASTATIN CALCIUM 5 MG PO TABS
5.0000 mg | ORAL_TABLET | Freq: Every day | ORAL | 1 refills | Status: DC
Start: 2021-01-08 — End: 2021-02-05

## 2021-01-08 MED ORDER — METOPROLOL TARTRATE 25 MG PO TABS: 25.0000 mg | ORAL_TABLET | Freq: Every day | ORAL | 0 refills | Status: DC

## 2021-01-09 LAB — BASIC METABOLIC PANEL
BUN/Creatinine Ratio: 18 (ref 9–20)
BUN: 18 mg/dL (ref 6–24)
CO2: 21 mmol/L (ref 20–29)
Calcium: 10.2 mg/dL (ref 8.7–10.2)
Chloride: 100 mmol/L (ref 96–106)
Creatinine, Ser: 1.02 mg/dL (ref 0.76–1.27)
Glucose: 136 mg/dL — ABNORMAL HIGH (ref 65–99)
Potassium: 4 mmol/L (ref 3.5–5.2)
Sodium: 138 mmol/L (ref 134–144)
eGFR: 89 mL/min/{1.73_m2} (ref >59)

## 2021-01-09 LAB — T4: T4, Total: 9.4 ug/dL (ref 4.5–12.0)

## 2021-01-09 LAB — TSH: TSH: 2.78 u[IU]/mL (ref 0.450–4.500)

## 2021-01-09 LAB — HEMOGLOBIN A1C
Est. average glucose Bld gHb Est-mCnc: 131 mg/dL
Hgb A1c MFr Bld: 6.2 % — ABNORMAL HIGH (ref 4.8–5.6)

## 2021-01-09 LAB — LIPID PANEL
Chol/HDL Ratio: 6.8 ratio — ABNORMAL HIGH (ref 0.0–5.0)
Cholesterol, Total: 190 mg/dL (ref 100–199)
HDL: 28 mg/dL — ABNORMAL LOW (ref >39)
LDL Chol Calc (NIH): 104 mg/dL — ABNORMAL HIGH (ref 0–99)
Triglycerides: 338 mg/dL — ABNORMAL HIGH (ref 0–149)
VLDL Cholesterol Cal: 58 mg/dL — ABNORMAL HIGH (ref 5–40)

## 2021-01-09 NOTE — Progress Notes (Signed)
Hi Isaiah Terrell, it was a pleasure meeting you yesterday.  Your lab work shows that your liver, kidneys and electrolytes are within normal range.   Your cholesterol is still high, but continued recommendations to make lifestyle changes. Your LDL is above normal. The LDL is the bad cholesterol. Over time and in combination with inflammation and other factors, this contributes to plaque which in turn may lead to stroke and/or heart attack down the road. Sometimes high LDL is primarily genetic, and people might be eating all the right foods but still have high numbers. Other times, there is room for improvement in one's diet and eating healthier can bring this number down and potentially reduce one's risk of heart attack and/or stroke.   To reduce your LDL, Remember - more fruits and vegetables, more fish, and limit red meat and dairy products. More soy, nuts, beans, barley, lentils, oats and plant sterol ester enriched margarine instead of butter. I also encourage eliminating sugar and processed food. Remember, shop on the outside of the grocery store and visit your [employer]. If you would like to talk with me about dietary changes plus or minus medications for your cholesterol, please let me know. We should recheck your cholesterol in 3-6 months.  Since we added the Crestor, we should see an improvement in this.    Your A1c which is the measure for diabetes increased to 6.2.  This is almost in the Diabetic range.  Like we discussed in the visit, we want to keep this at 6 or below.  I recommend increasing your exercise and decreasing the carbohydrates in your diet.  Please let me know if you have any questions.  We will recheck this at your next visit.

## 2021-02-02 NOTE — Progress Notes (Signed)
BP 137/88   Pulse 75   Temp 98.5 F (36.9 C)   Wt 264 lb (119.7 kg)   SpO2 97%   BMI 37.34 kg/m    Subjective:    Patient ID: Isaiah Terrell, male    DOB: August 18, 1971, 50 y.o.   MRN: [id number]  HPI: Isaiah Terrell is a 50 y.o. male  Chief Complaint  Patient presents with  . Hypertension   HYPERTENSION / [place] Satisfied with current treatment? yes Duration of hypertension: years BP monitoring frequency: daily BP range: 118-128/80s BP medication side effects: no Past BP meds: Metoprolol, amlodipine, HCTZ and losartan (cozaar) Duration of hyperlipidemia: years Cholesterol medication side effects: no Cholesterol supplements: none Past cholesterol medications: rosuvastatin (crestor) Medication compliance: excellent compliance Aspirin: no Recent stressors: no Recurrent headaches: no Visual changes: no Palpitations: no Dyspnea: no Chest pain: no Lower extremity edema: no Dizzy/lightheaded: no  Relevant past medical, surgical, family and social history reviewed and updated as indicated. Interim medical history since our last visit reviewed. Allergies and medications reviewed and updated.  Review of Systems  Eyes: Negative for visual disturbance.  Respiratory: Negative for shortness of breath.   Cardiovascular: Negative for chest pain and leg swelling.  Neurological: Negative for light-headedness and headaches.    Per HPI unless specifically indicated above     Objective:    BP 137/88   Pulse 75   Temp 98.5 F (36.9 C)   Wt 264 lb (119.7 kg)   SpO2 97%   BMI 37.34 kg/m   Wt Readings from Last 3 Encounters:  02/05/21 264 lb (119.7 kg)  01/08/21 264 lb 8 oz (120 kg)  07/10/20 264 lb 3.2 oz (119.8 kg)    Physical Exam Vitals and nursing note reviewed.  Constitutional:      General: He is not in acute distress.    Appearance: Normal appearance. He is not ill-appearing, toxic-appearing or diaphoretic.  HENT:     Head: Normocephalic.     Right Ear:  External ear normal.     Left Ear: External ear normal.     Nose: Nose normal. No congestion or rhinorrhea.     Mouth/Throat:     Mouth: Mucous membranes are moist.  Eyes:     General:        Right eye: No discharge.        Left eye: No discharge.     Extraocular Movements: Extraocular movements intact.     Conjunctiva/sclera: Conjunctivae normal.     Pupils: Pupils are equal, round, and reactive to light.  Cardiovascular:     Rate and Rhythm: Normal rate and regular rhythm.     Heart sounds: No murmur heard.   Pulmonary:     Effort: Pulmonary effort is normal. No respiratory distress.     Breath sounds: Normal breath sounds. No wheezing, rhonchi or rales.  Abdominal:     General: Abdomen is flat. Bowel sounds are normal.  Musculoskeletal:     Cervical back: Normal range of motion and neck supple.     Right lower leg: No edema.     Left lower leg: No edema.  Skin:    General: Skin is warm and dry.     Capillary Refill: Capillary refill takes less than 2 seconds.  Neurological:     General: No focal deficit present.     Mental Status: He is alert and oriented to person, place, and time.  Psychiatric:        Mood and Affect: Mood  normal.        Behavior: Behavior normal.        Thought Content: Thought content normal.        Judgment: Judgment normal.     Results for orders placed or performed in visit on 67/01/10  Basic Metabolic Panel (BMET)  Result Value Ref Range   Glucose 136 (H) 65 - 99 mg/dL   BUN 18 6 - 24 mg/dL   Creatinine, Ser 1.02 0.76 - 1.27 mg/dL   eGFR 89 >59 mL/min/1.73   BUN/Creatinine Ratio 18 9 - 20   Sodium 138 134 - 144 mmol/L   Potassium 4.0 3.5 - 5.2 mmol/L   Chloride 100 96 - 106 mmol/L   CO2 21 20 - 29 mmol/L   Calcium 10.2 8.7 - 10.2 mg/dL  Lipid Profile  Result Value Ref Range   Cholesterol, Total 190 100 - 199 mg/dL   Triglycerides 338 (H) 0 - 149 mg/dL   HDL 28 (L) >39 mg/dL   VLDL Cholesterol Cal 58 (H) 5 - 40 mg/dL   LDL Chol  Calc (NIH) 104 (H) 0 - 99 mg/dL   Chol/HDL Ratio 6.8 (H) 0.0 - 5.0 ratio  TSH  Result Value Ref Range   TSH 2.780 0.450 - 4.500 uIU/mL  T4  Result Value Ref Range   T4, Total 9.4 4.5 - 12.0 ug/dL  HgB A1c  Result Value Ref Range   Hgb A1c MFr Bld 6.2 (H) 4.8 - 5.6 %   Est. average glucose Bld gHb Est-mCnc 131 mg/dL      Assessment & Plan:   Problem List Items Addressed This Visit      Cardiovascular and Mediastinum   Essential hypertension - Primary    Chronic.  Slightly elevated at visit today but patient brought in record from home which showed readings between 116-128/80s.  Continue with current regimen.  Follow up in 6 months.       Relevant Medications   amLODipine (NORVASC) 5 MG tablet   hydrochlorothiazide (HYDRODIURIL) 25 MG tablet   losartan (COZAAR) 100 MG tablet   metoprolol tartrate (LOPRESSOR) 25 MG tablet   rosuvastatin (CRESTOR) 5 MG tablet   sildenafil (REVATIO) 20 MG tablet     Other   Hyperlipidemia    Chronic.  On Crestor 67m.  Tolerating medication without side effects.  Will increase at next visit to 143mif patient continues to tolerate medication.       Relevant Medications   amLODipine (NORVASC) 5 MG tablet   hydrochlorothiazide (HYDRODIURIL) 25 MG tablet   losartan (COZAAR) 100 MG tablet   metoprolol tartrate (LOPRESSOR) 25 MG tablet   rosuvastatin (CRESTOR) 5 MG tablet   sildenafil (REVATIO) 20 MG tablet       Follow up plan: Return in about 6 months (around 08/07/2021) for Physical and Fasting labs.   A total of 20 minutes were spent on this encounter today.  When total time is documented, this includes both the face-to-face and non-face-to-face time personally spent before, during and after the visit on the date of the encounter.

## 2021-02-05 ENCOUNTER — Ambulatory Visit (INDEPENDENT_AMBULATORY_CARE_PROVIDER_SITE_OTHER): Payer: 59 | Admitting: Nurse Practitioner

## 2021-02-05 ENCOUNTER — Encounter: Payer: Self-pay | Admitting: Nurse Practitioner

## 2021-02-05 ENCOUNTER — Other Ambulatory Visit: Payer: Self-pay

## 2021-02-05 VITALS — BP 137/88 | HR 75 | Temp 98.5°F | Wt 264.0 lb

## 2021-02-05 DIAGNOSIS — I10 Essential (primary) hypertension: Secondary | ICD-10-CM

## 2021-02-05 DIAGNOSIS — E782 Mixed hyperlipidemia: Secondary | ICD-10-CM

## 2021-02-05 MED ORDER — METOPROLOL TARTRATE 25 MG PO TABS: 25.0000 mg | ORAL_TABLET | Freq: Every day | ORAL | 1 refills | Status: DC

## 2021-02-05 MED ORDER — HYDROCHLOROTHIAZIDE 25 MG PO TABS
25.0000 mg | ORAL_TABLET | Freq: Every day | ORAL | 1 refills | Status: DC
Start: 2021-02-05 — End: 2021-08-07

## 2021-02-05 MED ORDER — AMLODIPINE BESYLATE 5 MG PO TABS: 5.0000 mg | ORAL_TABLET | Freq: Every day | ORAL | 1 refills | Status: DC

## 2021-02-05 MED ORDER — LOSARTAN POTASSIUM 100 MG PO TABS: 100.0000 mg | ORAL_TABLET | Freq: Every day | ORAL | 1 refills | Status: DC

## 2021-02-05 MED ORDER — ROSUVASTATIN CALCIUM 5 MG PO TABS: 5.0000 mg | ORAL_TABLET | Freq: Every day | ORAL | 1 refills | Status: DC

## 2021-02-05 MED ORDER — SILDENAFIL CITRATE 20 MG PO TABS: ORAL_TABLET | ORAL | 12 refills | Status: DC

## 2021-02-05 NOTE — Assessment & Plan Note (Signed)
Chronic.  Slightly elevated at visit today but patient brought in record from home which showed readings between 116-128/80s.  Continue with current regimen.  Follow up in 6 months.

## 2021-02-05 NOTE — Assessment & Plan Note (Signed)
Chronic.  On Crestor 5mg .  Tolerating medication without side effects.  Will increase at next visit to 10mg  if patient continues to tolerate medication.

## 2021-07-26 ENCOUNTER — Other Ambulatory Visit: Payer: Self-pay | Admitting: Nurse Practitioner

## 2021-07-27 NOTE — Telephone Encounter (Signed)
Requested Prescriptions  Pending Prescriptions Disp Refills  . metoprolol tartrate (LOPRESSOR) 25 MG tablet [Pharmacy Med Name: METOPROLOL TARTRATE 25 MG TAB] 90 tablet 0    Sig: Take 1 tablet (25 mg total) by mouth daily.     Cardiovascular:  Beta Blockers Passed - 07/26/2021 12:59 PM      Passed - Last BP in normal range    BP Readings from Last 1 Encounters:  02/05/21 137/88         Passed - Last Heart Rate in normal range    Pulse Readings from Last 1 Encounters:  02/05/21 75         Passed - Valid encounter within last 6 months    Recent Outpatient Visits          5 months ago Essential hypertension   [facility] Larae Grooms, NP   6 months ago Essential hypertension   [facility] Larae Grooms, NP   1 year ago Annual physical exam   [facility] Valentino Nose, NP   1 year ago Essential hypertension   [facility] Particia Nearing, [place]   2 years ago Essential hypertension   [facility], Salley Hews, [place]      Future Appointments            In 1 week Larae Grooms, NP [facility]

## 2021-08-06 NOTE — Progress Notes (Signed)
BP 138/85   Pulse 66   Temp 98 F (36.7 C) (Oral)   Ht 6' 3.5" (1.918 m)   Wt 267 lb 4 oz (121.2 kg)   SpO2 97%   BMI 32.96 kg/m    Subjective:    Patient ID: Isaiah Terrell, male    DOB: 05-16-1971, 50 y.o.   MRN: [id number]  HPI: Isaiah Terrell is a 50 y.o. male presenting on 08/07/2021 for comprehensive medical examination. Current medical complaints include:none  He currently lives with: Interim Problems from his last visit: no  HYPERTENSION / HYPERLIPIDEMIA Satisfied with current treatment? no Duration of hypertension: years BP monitoring frequency: weekly BP range: 130/80 BP medication side effects: no Past BP meds:  Metoprolol, amlodipine, HCTZ, and losartan (cozaar)- patient has been refilled Duration of hyperlipidemia: years Cholesterol medication side effects: no Cholesterol supplements: none Past cholesterol medications: rosuvastatin (crestor) Medication compliance: excellent compliance Aspirin: no Recent stressors: no Recurrent headaches: no Visual changes: no Palpitations: no Dyspnea: no Chest pain: no Lower extremity edema: no Dizzy/lightheaded: no   Depression Screen done today and results listed below:  Depression screen [facility] 2/9 08/07/2021 07/10/2020 06/30/2019 05/05/2018 06/26/2017  Decreased Interest 0 0 0 0 0  Down, Depressed, Hopeless 0 0 0 0 0  PHQ - 2 Score 0 0 0 0 0  Altered sleeping 0 - 0 - -  Tired, decreased energy 0 - 0 - -  Change in appetite 0 - 0 - -  Feeling bad or failure about yourself  0 - 0 - -  Trouble concentrating 0 - 0 - -  Moving slowly or fidgety/restless 0 - 0 - -  Suicidal thoughts 0 - 0 - -  PHQ-9 Score 0 - 0 - -  Difficult doing work/chores Not difficult at all - Not difficult at all - -    The patient does not have a history of falls. I did complete a risk assessment for falls. A plan of care for falls was documented.   Past Medical History:  History reviewed. No pertinent past medical history.  Surgical History:   Past Surgical History:  Procedure Laterality Date   HERNIA REPAIR     HYDROCELE EXCISION / REPAIR     KNEE CARTILAGE SURGERY Left 2008    Medications:  Current Outpatient Medications on File Prior to Visit  Medication Sig   sildenafil (REVATIO) 20 MG tablet Take 1-5 tablets prn   triamcinolone cream (KENALOG) 0.1 % Apply 1 application topically 2 (two) times daily.   [DISCONTINUED] metoprolol succinate (TOPROL-XL) 25 MG 24 hr tablet Take 1 tablet (25 mg total) by mouth daily.   No current facility-administered medications on file prior to visit.    Allergies:  Allergies  Allergen Reactions   Benazepril Cough   Amlodipine Other (See Comments)    Mild edema with 10 mg dose does fine with 30m    Social History:  Social History   Socioeconomic History   Marital status: Married    Spouse name: Not on file   Number of children: Not on file   Years of education: Not on file   Highest education level: Not on file  Occupational History   Not on file  Tobacco Use   Smoking status: Never   Smokeless tobacco: Never  Vaping Use   Vaping Use: Never used  Substance and Sexual Activity   Alcohol use: No   Drug use: No   Sexual activity: Not on file  Other Topics Concern  Not on file  Social History Narrative   Not on file   Social Determinants of Health   Financial Resource Strain: Not on file  Food Insecurity: Not on file  Transportation Needs: Not on file  Physical Activity: Not on file  Stress: Not on file  Social Connections: Not on file  Intimate Partner Violence: Not on file   Social History   Tobacco Use  Smoking Status Never  Smokeless Tobacco Never   Social History   Substance and Sexual Activity  Alcohol Use No    Family History:  Family History  Problem Relation Age of Onset   Cancer Mother        ovarian   Hypertension Mother    Hypertension Father    Stroke Father    Heart disease Father    Cancer Father        prostate    Past  medical history, surgical history, medications, allergies, family history and social history reviewed with patient today and changes made to appropriate areas of the chart.   Review of Systems  Eyes:  Negative for blurred vision and double vision.  Respiratory:  Negative for shortness of breath.   Cardiovascular:  Negative for chest pain, palpitations and leg swelling.  Neurological:  Negative for dizziness and headaches.  All other ROS negative except what is listed above and in the HPI.      Objective:    BP 138/85   Pulse 66   Temp 98 F (36.7 C) (Oral)   Ht 6' 3.5" (1.918 m)   Wt 267 lb 4 oz (121.2 kg)   SpO2 97%   BMI 32.96 kg/m   Wt Readings from Last 3 Encounters:  08/07/21 267 lb 4 oz (121.2 kg)  02/05/21 264 lb (119.7 kg)  01/08/21 264 lb 8 oz (120 kg)    Physical Exam Vitals and nursing note reviewed.  Constitutional:      General: He is not in acute distress.    Appearance: Normal appearance. He is obese. He is not ill-appearing, toxic-appearing or diaphoretic.  HENT:     Head: Normocephalic.     Right Ear: Tympanic membrane, ear canal and external ear normal.     Left Ear: Tympanic membrane, ear canal and external ear normal.     Nose: Nose normal. No congestion or rhinorrhea.     Mouth/Throat:     Mouth: Mucous membranes are moist.  Eyes:     General:        Right eye: No discharge.        Left eye: No discharge.     Extraocular Movements: Extraocular movements intact.     Conjunctiva/sclera: Conjunctivae normal.     Pupils: Pupils are equal, round, and reactive to light.  Cardiovascular:     Rate and Rhythm: Normal rate and regular rhythm.     Heart sounds: No murmur heard. Pulmonary:     Effort: Pulmonary effort is normal. No respiratory distress.     Breath sounds: Normal breath sounds. No wheezing, rhonchi or rales.  Abdominal:     General: Abdomen is flat. Bowel sounds are normal. There is no distension.     Palpations: Abdomen is soft.      Tenderness: There is no abdominal tenderness. There is no guarding.  Musculoskeletal:     Cervical back: Normal range of motion and neck supple.  Skin:    General: Skin is warm and dry.     Capillary Refill: Capillary refill takes  less than 2 seconds.  Neurological:     General: No focal deficit present.     Mental Status: He is alert and oriented to person, place, and time.     Cranial Nerves: No cranial nerve deficit.     Motor: No weakness.     Deep Tendon Reflexes: Reflexes normal.  Psychiatric:        Mood and Affect: Mood normal.        Behavior: Behavior normal.        Thought Content: Thought content normal.        Judgment: Judgment normal.    Results for orders placed or performed in visit on 25/05/39  Basic Metabolic Panel (BMET)  Result Value Ref Range   Glucose 136 (H) 65 - 99 mg/dL   BUN 18 6 - 24 mg/dL   Creatinine, Ser 1.02 0.76 - 1.27 mg/dL   eGFR 89 >59 mL/min/1.73   BUN/Creatinine Ratio 18 9 - 20   Sodium 138 134 - 144 mmol/L   Potassium 4.0 3.5 - 5.2 mmol/L   Chloride 100 96 - 106 mmol/L   CO2 21 20 - 29 mmol/L   Calcium 10.2 8.7 - 10.2 mg/dL  Lipid Profile  Result Value Ref Range   Cholesterol, Total 190 100 - 199 mg/dL   Triglycerides 338 (H) 0 - 149 mg/dL   HDL 28 (L) >39 mg/dL   VLDL Cholesterol Cal 58 (H) 5 - 40 mg/dL   LDL Chol Calc (NIH) 104 (H) 0 - 99 mg/dL   Chol/HDL Ratio 6.8 (H) 0.0 - 5.0 ratio  TSH  Result Value Ref Range   TSH 2.780 0.450 - 4.500 uIU/mL  T4  Result Value Ref Range   T4, Total 9.4 4.5 - 12.0 ug/dL  HgB A1c  Result Value Ref Range   Hgb A1c MFr Bld 6.2 (H) 4.8 - 5.6 %   Est. average glucose Bld gHb Est-mCnc 131 mg/dL      Assessment & Plan:   Problem List Items Addressed This Visit       Cardiovascular and Mediastinum   Essential hypertension    Chronic.  Controlled.  Continue with current medication regimen with Metoprolol 4m, Losartan 1079m and Amlodipine 28m76m Refills sent today.  Labs ordered today.   Return to clinic in 6 months for reevaluation.  Call sooner if concerns arise.        Relevant Medications   metoprolol tartrate (LOPRESSOR) 25 MG tablet   amLODipine (NORVASC) 5 MG tablet   hydrochlorothiazide (HYDRODIURIL) 25 MG tablet   losartan (COZAAR) 100 MG tablet   rosuvastatin (CRESTOR) 10 MG tablet     Endocrine   Subclinical hypothyroidism    Labs ordered today. Will make recommendations based on lab results.       Relevant Medications   metoprolol tartrate (LOPRESSOR) 25 MG tablet   Other Relevant Orders   T4, free     Other   Hyperlipidemia    Chronic.  Controlled.  Continue with current medication regimen.  Increased Crestor 38m528mLabs ordered today.  Return to clinic in 6 months for reevaluation.  Call sooner if concerns arise.        Relevant Medications   metoprolol tartrate (LOPRESSOR) 25 MG tablet   amLODipine (NORVASC) 5 MG tablet   hydrochlorothiazide (HYDRODIURIL) 25 MG tablet   losartan (COZAAR) 100 MG tablet   rosuvastatin (CRESTOR) 10 MG tablet   Prediabetes    Labs ordered today.  Will make recommendations based  on lab results.       Relevant Orders   HgB A1c   Other Visit Diagnoses     Annual physical exam    -  Primary   Health mainteneance reviewed during visit today. Labs ordered today.    Relevant Orders   TSH   PSA   Lipid panel   CBC with Differential/Platelet   Comprehensive metabolic panel   Urinalysis, Routine w reflex microscopic   Colon cancer screening       Relevant Orders   Ambulatory referral to Gastroenterology   Need for influenza vaccination       Relevant Orders   Flu Vaccine QUAD 57moIM (Fluarix, Fluzone & Alfiuria Quad PF)        Discussed aspirin prophylaxis for myocardial infarction prevention and decision was it was not indicated  LABORATORY TESTING:  Health maintenance labs ordered today as discussed above.   The natural history of prostate cancer and ongoing controversy regarding screening and  potential treatment outcomes of prostate cancer has been discussed with the patient. The meaning of a false positive PSA and a false negative PSA has been discussed. He indicates understanding of the limitations of this screening test and wishes to proceed with screening PSA testing.   IMMUNIZATIONS:   - Tdap: Tetanus vaccination status reviewed: last tetanus booster within 10 years. - Influenza: Administered today - Pneumovax: Not applicable - Prevnar: Not applicable - HPV: Not applicable - Zostavax vaccine:  Discussed today  SCREENING: - Colonoscopy: Order given today  Discussed with patient purpose of the colonoscopy is to detect colon cancer at curable precancerous or early stages   - AAA Screening: Not applicable  -Hearing Test: Not applicable  -Spirometry: Not applicable   PATIENT COUNSELING:    Sexuality: Discussed sexually transmitted diseases, partner selection, use of condoms, avoidance of unintended pregnancy  and contraceptive alternatives.   Advised to avoid cigarette smoking.  I discussed with the patient that most people either abstain from alcohol or drink within safe limits (<=14/week and <=4 drinks/occasion for males, <=7/weeks and <= 3 drinks/occasion for females) and that the risk for alcohol disorders and other health effects rises proportionally with the number of drinks per week and how often a drinker exceeds daily limits.  Discussed cessation/primary prevention of drug use and availability of treatment for abuse.   Diet: Encouraged to adjust caloric intake to maintain  or achieve ideal body weight, to reduce intake of dietary saturated fat and total fat, to limit sodium intake by avoiding high sodium foods and not adding table salt, and to maintain adequate dietary potassium and calcium preferably from fresh fruits, vegetables, and low-fat dairy products.    stressed the importance of regular exercise  Injury prevention: Discussed safety belts, safety  helmets, smoke detector, smoking near bedding or upholstery.   Dental health: Discussed importance of regular tooth brushing, flossing, and dental visits.   Follow up plan: NEXT PREVENTATIVE PHYSICAL DUE IN 1 YEAR. Return in about 6 months (around 02/05/2022) for HTN, HLD, DM2 FU.

## 2021-08-07 ENCOUNTER — Encounter: Payer: Self-pay | Admitting: Nurse Practitioner

## 2021-08-07 ENCOUNTER — Other Ambulatory Visit: Payer: Self-pay

## 2021-08-07 ENCOUNTER — Ambulatory Visit (INDEPENDENT_AMBULATORY_CARE_PROVIDER_SITE_OTHER): Payer: 59 | Admitting: Nurse Practitioner

## 2021-08-07 VITALS — BP 138/85 | HR 66 | Temp 98.0°F | Ht 75.5 in | Wt 267.2 lb

## 2021-08-07 DIAGNOSIS — Z23 Encounter for immunization: Secondary | ICD-10-CM | POA: Diagnosis not present

## 2021-08-07 DIAGNOSIS — E782 Mixed hyperlipidemia: Secondary | ICD-10-CM | POA: Diagnosis not present

## 2021-08-07 DIAGNOSIS — Z0000 Encounter for general adult medical examination without abnormal findings: Secondary | ICD-10-CM

## 2021-08-07 DIAGNOSIS — R7303 Prediabetes: Secondary | ICD-10-CM

## 2021-08-07 DIAGNOSIS — E038 Other specified hypothyroidism: Secondary | ICD-10-CM | POA: Diagnosis not present

## 2021-08-07 DIAGNOSIS — I10 Essential (primary) hypertension: Secondary | ICD-10-CM

## 2021-08-07 DIAGNOSIS — Z1211 Encounter for screening for malignant neoplasm of colon: Secondary | ICD-10-CM

## 2021-08-07 LAB — URINALYSIS, ROUTINE W REFLEX MICROSCOPIC
Bilirubin, UA: NEGATIVE
Glucose, UA: NEGATIVE
Ketones, UA: NEGATIVE
Leukocytes,UA: NEGATIVE
Nitrite, UA: NEGATIVE
Protein,UA: NEGATIVE
RBC, UA: NEGATIVE
Specific Gravity, UA: 1.02 (ref 1.005–1.030)
Urobilinogen, Ur: 0.2 mg/dL (ref 0.2–1.0)
pH, UA: 7 (ref 5.0–7.5)

## 2021-08-07 MED ORDER — ROSUVASTATIN CALCIUM 10 MG PO TABS: 5.0000 mg | ORAL_TABLET | Freq: Every day | ORAL | 1 refills | Status: DC

## 2021-08-07 MED ORDER — AMLODIPINE BESYLATE 5 MG PO TABS: 5.0000 mg | ORAL_TABLET | Freq: Every day | ORAL | 1 refills | Status: DC

## 2021-08-07 MED ORDER — METOPROLOL TARTRATE 25 MG PO TABS: 25.0000 mg | ORAL_TABLET | Freq: Every day | ORAL | 1 refills | Status: DC

## 2021-08-07 MED ORDER — HYDROCHLOROTHIAZIDE 25 MG PO TABS: 25.0000 mg | ORAL_TABLET | Freq: Every day | ORAL | 1 refills | Status: DC

## 2021-08-07 MED ORDER — ROSUVASTATIN CALCIUM 10 MG PO TABS: 10.0000 mg | ORAL_TABLET | Freq: Every day | ORAL | 1 refills | Status: DC

## 2021-08-07 MED ORDER — LOSARTAN POTASSIUM 100 MG PO TABS: 100.0000 mg | ORAL_TABLET | Freq: Every day | ORAL | 1 refills | Status: DC

## 2021-08-07 NOTE — Assessment & Plan Note (Signed)
Labs ordered today.  Will make recommendations based on lab results. ?

## 2021-08-07 NOTE — Assessment & Plan Note (Signed)
Chronic.  Controlled.  Continue with current medication regimen.  Increased Crestor 10mg .  Labs ordered today.  Return to clinic in 6 months for reevaluation.  Call sooner if concerns arise.

## 2021-08-07 NOTE — Assessment & Plan Note (Signed)
Chronic.  Controlled.  Continue with current medication regimen with Metoprolol 25mg, Losartan 100mg, and Amlodipine 5mg.  Refills sent today.  Labs ordered today.  Return to clinic in 6 months for reevaluation.  Call sooner if concerns arise.   

## 2021-08-08 LAB — COMPREHENSIVE METABOLIC PANEL
ALT: 64 IU/L — ABNORMAL HIGH (ref 0–44)
AST: 37 IU/L (ref 0–40)
Albumin/Globulin Ratio: 2.1 (ref 1.2–2.2)
Albumin: 4.7 g/dL (ref 3.8–4.9)
Alkaline Phosphatase: 73 IU/L (ref 44–121)
BUN/Creatinine Ratio: 13 (ref 9–20)
BUN: 13 mg/dL (ref 6–24)
Bilirubin Total: 0.6 mg/dL (ref 0.0–1.2)
CO2: 20 mmol/L (ref 20–29)
Calcium: 9.9 mg/dL (ref 8.7–10.2)
Chloride: 99 mmol/L (ref 96–106)
Creatinine, Ser: 0.97 mg/dL (ref 0.76–1.27)
Globulin, Total: 2.2 g/dL (ref 1.5–4.5)
Glucose: 112 mg/dL — ABNORMAL HIGH (ref 70–99)
Potassium: 4.3 mmol/L (ref 3.5–5.2)
Sodium: 136 mmol/L (ref 134–144)
Total Protein: 6.9 g/dL (ref 6.0–8.5)
eGFR: 95 mL/min/{1.73_m2} (ref >59)

## 2021-08-08 LAB — HEMOGLOBIN A1C
Est. average glucose Bld gHb Est-mCnc: 120 mg/dL
Hgb A1c MFr Bld: 5.8 % — ABNORMAL HIGH (ref 4.8–5.6)

## 2021-08-08 LAB — LIPID PANEL
Chol/HDL Ratio: 5.4 ratio — ABNORMAL HIGH (ref 0.0–5.0)
Cholesterol, Total: 158 mg/dL (ref 100–199)
HDL: 29 mg/dL — ABNORMAL LOW (ref >39)
LDL Chol Calc (NIH): 101 mg/dL — ABNORMAL HIGH (ref 0–99)
Triglycerides: 155 mg/dL — ABNORMAL HIGH (ref 0–149)
VLDL Cholesterol Cal: 28 mg/dL (ref 5–40)

## 2021-08-08 LAB — CBC WITH DIFFERENTIAL/PLATELET
Basophils Absolute: 0.1 10*3/uL (ref 0.0–0.2)
Basos: 1 %
EOS (ABSOLUTE): 0.1 10*3/uL (ref 0.0–0.4)
Eos: 1 %
Hematocrit: 49.8 % (ref 37.5–51.0)
Hemoglobin: 16.9 g/dL (ref 13.0–17.7)
Immature Grans (Abs): 0 10*3/uL (ref 0.0–0.1)
Immature Granulocytes: 0 %
Lymphocytes Absolute: 1.4 10*3/uL (ref 0.7–3.1)
Lymphs: 25 %
MCH: 29.4 pg (ref 26.6–33.0)
MCHC: 33.9 g/dL (ref 31.5–35.7)
MCV: 87 fL (ref 79–97)
Monocytes Absolute: 0.5 10*3/uL (ref 0.1–0.9)
Monocytes: 8 %
Neutrophils Absolute: 3.7 10*3/uL (ref 1.4–7.0)
Neutrophils: 65 %
Platelets: 287 10*3/uL (ref 150–450)
RBC: 5.75 x10E6/uL (ref 4.14–5.80)
RDW: 13.1 % (ref 11.6–15.4)
WBC: 5.8 10*3/uL (ref 3.4–10.8)

## 2021-08-08 LAB — T4, FREE: Free T4: 1.37 ng/dL (ref 0.82–1.77)

## 2021-08-08 LAB — PSA: Prostate Specific Ag, Serum: 1.9 ng/mL (ref 0.0–4.0)

## 2021-08-08 LAB — TSH: TSH: 2.74 u[IU]/mL (ref 0.450–4.500)

## 2021-08-08 NOTE — Progress Notes (Signed)
Hi Kamron.  Overall your lab work looks good.  Thyroid is in normal range.  Your cholesterol is elevated but improved from prior.  Continue to cut back on processed foods and refined sugar intake. Your A1c is well controlled at 5.8.  Keep up the good work.  Please let me know if you have any questions.

## 2021-10-02 ENCOUNTER — Ambulatory Visit: Payer: 59 | Admitting: Nurse Practitioner

## 2021-10-09 ENCOUNTER — Ambulatory Visit: Payer: Self-pay

## 2021-10-09 NOTE — Telephone Encounter (Signed)
°  Chief Complaint: Right shoulder pain Symptoms: Has swelling at neck Frequency: Started 10 days ago. Went to UC. Pertinent Negatives: Patient denies numbness Disposition: [] ED /[] Urgent Care (no appt availability in office) / [x] Appointment(In office/virtual)/ []  [facility]/ [] Home Care/ [] Refused Recommended Disposition  Additional Notes:     Reason for Disposition  [1] MODERATE pain (e.g., interferes with normal activities) AND [2] present > 3 days  Answer Assessment - Initial Assessment Questions 1. ONSET: "When did the pain start?"     10 days ago 2. LOCATION: "Where is the pain located?"     Right shoulder 3. PAIN: "How bad is the pain?" (Scale 1-10; or mild, moderate, severe)   - MILD (1-3): doesn't interfere with normal activities   - MODERATE (4-7): interferes with normal activities (e.g., work or school) or awakens from sleep   - SEVERE (8-10): excruciating pain, unable to do any normal activities, unable to move arm at all due to pain     9 4. WORK OR EXERCISE: "Has there been any recent work or exercise that involved this part of the body?"     No 5. CAUSE: "What do you think is causing the shoulder pain?"     Unsure 6. OTHER SYMPTOMS: "Do you have any other symptoms?" (e.g., neck pain, swelling, rash, fever, numbness, weakness)     Swelling  7. PREGNANCY: "Is there any chance you are pregnant?" "When was your last menstrual period?"     no  Protocols used: Shoulder Pain-A-AH

## 2021-10-10 ENCOUNTER — Other Ambulatory Visit: Payer: Self-pay

## 2021-10-10 ENCOUNTER — Ambulatory Visit: Payer: 59 | Admitting: Nurse Practitioner

## 2021-10-10 ENCOUNTER — Encounter: Payer: Self-pay | Admitting: Nurse Practitioner

## 2021-10-10 VITALS — BP 122/79 | HR 76 | Temp 98.7°F | Ht 70.0 in | Wt 265.2 lb

## 2021-10-10 DIAGNOSIS — M62838 Other muscle spasm: Secondary | ICD-10-CM | POA: Diagnosis not present

## 2021-10-10 MED ORDER — KETOROLAC TROMETHAMINE 60 MG/2ML IM SOLN: 60.0000 mg | Freq: Once | INTRAMUSCULAR | 0 refills | Status: DC

## 2021-10-10 MED ORDER — KETOROLAC TROMETHAMINE 60 MG/2ML IM SOLN
60.0000 mg | Freq: Once | INTRAMUSCULAR | Status: AC
  Administered 2021-10-10: 60 mg via INTRAMUSCULAR

## 2021-10-10 MED ORDER — PREDNISONE 10 MG PO TABS: 10.0000 mg | ORAL_TABLET | Freq: Every day | ORAL | 0 refills | Status: DC

## 2021-10-10 MED ORDER — CYCLOBENZAPRINE HCL 10 MG PO TABS
10.0000 mg | ORAL_TABLET | Freq: Three times a day (TID) | ORAL | 0 refills | Status: DC | PRN
Start: 2021-10-10 — End: 2021-12-20

## 2021-10-10 NOTE — Progress Notes (Signed)
BP 122/79    Pulse 76    Temp 98.7 F (37.1 C) (Oral)    Ht 5' 10"  (1.778 m)    Wt 265 lb 3.2 oz (120.3 kg)    SpO2 96%    BMI 38.05 kg/m    Subjective:    Patient ID: Isaiah Terrell, male    DOB: 1971-05-04, 50 y.o.   MRN: [id number]  HPI: Isaiah Terrell is a 50 y.o. male  Chief Complaint  Patient presents with   Shoulder Pain    Pt states he has been having R shoulder pain for a while now. States he went to [facility] and was given a steroid, finished it. States he is still having the pain and states it gets worse as the day goes on. States he can fill it all the way into his elbow.    SHOULDER PAIN Patient states he went to [facility] and was given a steroid pack and has finished it and the pain has returned.  Patient states it helped but then the pain returned and has worsened. Duration: chronic Involved shoulder: right Mechanism of injury: unknown Location: posterior Onset:gradual Severity: moderate  Quality:  aching Frequency: intermittent Radiation: yes Aggravating factors: movement  Alleviating factors:  steroids   Status: worse Treatments attempted: ice and heat  Relief with NSAIDs?:  no Weakness: no Numbness: yes Decreased grip strength: no Redness: no Swelling: yes Bruising: no Fevers: no    Relevant past medical, surgical, family and social history reviewed and updated as indicated. Interim medical history since our last visit reviewed. Allergies and medications reviewed and updated.  Review of Systems  Musculoskeletal:        Shoulder/neck pain   Per HPI unless specifically indicated above     Objective:    BP 122/79    Pulse 76    Temp 98.7 F (37.1 C) (Oral)    Ht 5' 10"  (1.778 m)    Wt 265 lb 3.2 oz (120.3 kg)    SpO2 96%    BMI 38.05 kg/m   Wt Readings from Last 3 Encounters:  10/10/21 265 lb 3.2 oz (120.3 kg)  08/07/21 267 lb 4 oz (121.2 kg)  02/05/21 264 lb (119.7 kg)    Physical Exam Vitals and nursing note reviewed.  Constitutional:      General: He is  not in acute distress.    Appearance: Normal appearance. He is not ill-appearing, toxic-appearing or diaphoretic.  HENT:     Head: Normocephalic.     Right Ear: External ear normal.     Left Ear: External ear normal.     Nose: Nose normal. No congestion or rhinorrhea.     Mouth/Throat:     Mouth: Mucous membranes are moist.  Eyes:     General:        Right eye: No discharge.        Left eye: No discharge.     Extraocular Movements: Extraocular movements intact.     Conjunctiva/sclera: Conjunctivae normal.     Pupils: Pupils are equal, round, and reactive to light.  Cardiovascular:     Rate and Rhythm: Normal rate and regular rhythm.     Heart sounds: No murmur heard. Pulmonary:     Effort: Pulmonary effort is normal. No respiratory distress.     Breath sounds: Normal breath sounds. No wheezing, rhonchi or rales.  Abdominal:     General: Abdomen is flat. Bowel sounds are normal.  Musculoskeletal:  Arms:     Cervical back: Normal range of motion and neck supple.  Skin:    General: Skin is warm and dry.     Capillary Refill: Capillary refill takes less than 2 seconds.  Neurological:     General: No focal deficit present.     Mental Status: He is alert and oriented to person, place, and time.  Psychiatric:        Mood and Affect: Mood normal.        Behavior: Behavior normal.        Thought Content: Thought content normal.        Judgment: Judgment normal.    Results for orders placed or performed in visit on 08/07/21  TSH  Result Value Ref Range   TSH 2.740 0.450 - 4.500 uIU/mL  PSA  Result Value Ref Range   Prostate Specific Ag, Serum 1.9 0.0 - 4.0 ng/mL  Lipid panel  Result Value Ref Range   Cholesterol, Total 158 100 - 199 mg/dL   Triglycerides 155 (H) 0 - 149 mg/dL   HDL 29 (L) >39 mg/dL   VLDL Cholesterol Cal 28 5 - 40 mg/dL   LDL Chol Calc (NIH) 101 (H) 0 - 99 mg/dL   Chol/HDL Ratio 5.4 (H) 0.0 - 5.0 ratio  CBC with Differential/Platelet  Result Value  Ref Range   WBC 5.8 3.4 - 10.8 x10E3/uL   RBC 5.75 4.14 - 5.80 x10E6/uL   Hemoglobin 16.9 13.0 - 17.7 g/dL   Hematocrit 49.8 37.5 - 51.0 %   MCV 87 79 - 97 fL   MCH 29.4 26.6 - 33.0 pg   MCHC 33.9 31.5 - 35.7 g/dL   RDW 13.1 11.6 - 15.4 %   Platelets 287 150 - 450 x10E3/uL   Neutrophils 65 Not Estab. %   Lymphs 25 Not Estab. %   Monocytes 8 Not Estab. %   Eos 1 Not Estab. %   Basos 1 Not Estab. %   Neutrophils Absolute 3.7 1.4 - 7.0 x10E3/uL   Lymphocytes Absolute 1.4 0.7 - 3.1 x10E3/uL   Monocytes Absolute 0.5 0.1 - 0.9 x10E3/uL   EOS (ABSOLUTE) 0.1 0.0 - 0.4 x10E3/uL   Basophils Absolute 0.1 0.0 - 0.2 x10E3/uL   Immature Granulocytes 0 Not Estab. %   Immature Grans (Abs) 0.0 0.0 - 0.1 x10E3/uL  Comprehensive metabolic panel  Result Value Ref Range   Glucose 112 (H) 70 - 99 mg/dL   BUN 13 6 - 24 mg/dL   Creatinine, Ser 0.97 0.76 - 1.27 mg/dL   eGFR 95 >59 mL/min/1.73   BUN/Creatinine Ratio 13 9 - 20   Sodium 136 134 - 144 mmol/L   Potassium 4.3 3.5 - 5.2 mmol/L   Chloride 99 96 - 106 mmol/L   CO2 20 20 - 29 mmol/L   Calcium 9.9 8.7 - 10.2 mg/dL   Total Protein 6.9 6.0 - 8.5 g/dL   Albumin 4.7 3.8 - 4.9 g/dL   Globulin, Total 2.2 1.5 - 4.5 g/dL   Albumin/Globulin Ratio 2.1 1.2 - 2.2   Bilirubin Total 0.6 0.0 - 1.2 mg/dL   Alkaline Phosphatase 73 44 - 121 IU/L   AST 37 0 - 40 IU/L   ALT 64 (H) 0 - 44 IU/L  Urinalysis, Routine w reflex microscopic  Result Value Ref Range   Specific Gravity, UA 1.020 1.005 - 1.030   pH, UA 7.0 5.0 - 7.5   Color, UA Yellow Yellow   Appearance Ur Clear Clear  Leukocytes,UA Negative Negative   Protein,UA Negative Negative/Trace   Glucose, UA Negative Negative   Ketones, UA Negative Negative   RBC, UA Negative Negative   Bilirubin, UA Negative Negative   Urobilinogen, Ur 0.2 0.2 - 1.0 mg/dL   Nitrite, UA Negative Negative  T4, free  Result Value Ref Range   Free T4 1.37 0.82 - 1.77 ng/dL  HgB A1c  Result Value Ref Range   Hgb  A1c MFr Bld 5.8 (H) 4.8 - 5.6 %   Est. average glucose Bld gHb Est-mCnc 120 mg/dL      Assessment & Plan:   Problem List Items Addressed This Visit   None Visit Diagnoses     Muscle spasm    -  Primary   Toradol given in office, flexeril given to take at night, prednisone taper given, recommend ibuprofen 819m x 5 days. FU if symptoms do not improve.        Follow up plan: Return if symptoms worsen or fail to improve.

## 2021-10-10 NOTE — Addendum Note (Signed)
Addended by: Pablo Ledger on: 10/10/2021 10:25 AM   Modules accepted: Orders

## 2021-10-12 ENCOUNTER — Ambulatory Visit: Payer: 59 | Admitting: Nurse Practitioner

## 2021-12-20 ENCOUNTER — Ambulatory Visit: Payer: 59 | Admitting: Nurse Practitioner

## 2021-12-20 ENCOUNTER — Encounter: Payer: Self-pay | Admitting: Nurse Practitioner

## 2021-12-20 ENCOUNTER — Other Ambulatory Visit: Payer: Self-pay

## 2021-12-20 VITALS — BP 122/73 | HR 71 | Temp 98.3°F | Wt 266.4 lb

## 2021-12-20 DIAGNOSIS — M62838 Other muscle spasm: Secondary | ICD-10-CM

## 2021-12-20 MED ORDER — CYCLOBENZAPRINE HCL 10 MG PO TABS: 10.0000 mg | ORAL_TABLET | Freq: Three times a day (TID) | ORAL | 0 refills | Status: DC | PRN

## 2021-12-20 MED ORDER — IBUPROFEN 800 MG PO TABS: 800.0000 mg | ORAL_TABLET | Freq: Three times a day (TID) | ORAL | 0 refills | Status: DC | PRN

## 2021-12-20 NOTE — Progress Notes (Signed)
? ?BP 122/73 (BP Location: Left Arm, Cuff Size: Large)   Pulse 71   Temp 98.3 ?F (36.8 ?C) (Oral)   Wt 266 lb 6.4 oz (120.8 kg)   SpO2 98%   BMI 38.22 kg/m?   ? ?Subjective:  ? ? Patient ID: Isaiah Terrell, male    DOB: 01/19/1971, 51 y.o.   MRN: [id number] ? ?HPI: ?Isaiah Terrell is a 51 y.o. male ? ?Chief Complaint  ?Patient presents with  ? Shoulder Pain  ?  Pt states that his R shoulder is still bothering him off and on  ? ?SHOULDER PAIN ?Duration: days ?Involved shoulder: right ?Mechanism of injury: unknown ?Location: posterior ?Onset:gradual ?Severity: mild  ?Quality:  aching ?Frequency: intermittent ?Radiation: yes ?Aggravating factors: sleep  ?Alleviating factors:  muscle relaxer   ?Status: better ?Treatments attempted:  muscle relaxer and ibuprofen  ?Relief with NSAIDs?:  moderate ?Weakness: no ?Numbness: yes ?Decreased grip strength: no ?Redness: no ?Swelling: no ?Bruising: no ?Fevers: no ? ?Relevant past medical, surgical, family and social history reviewed and updated as indicated. Interim medical history since our last visit reviewed. ?Allergies and medications reviewed and updated. ? ?Review of Systems  ?Musculoskeletal:   ?     Muscle spasm and elbow pain  ? ?Per HPI unless specifically indicated above ? ?   ?Objective:  ?  ?BP 122/73 (BP Location: Left Arm, Cuff Size: Large)   Pulse 71   Temp 98.3 ?F (36.8 ?C) (Oral)   Wt 266 lb 6.4 oz (120.8 kg)   SpO2 98%   BMI 38.22 kg/m?   ?Wt Readings from Last 3 Encounters:  ?12/20/21 266 lb 6.4 oz (120.8 kg)  ?10/10/21 265 lb 3.2 oz (120.3 kg)  ?08/07/21 267 lb 4 oz (121.2 kg)  ?  ?Physical Exam ?Vitals and nursing note reviewed.  ?Constitutional:   ?   General: He is not in acute distress. ?   Appearance: Normal appearance. He is not ill-appearing, toxic-appearing or diaphoretic.  ?HENT:  ?   Head: Normocephalic.  ?   Right Ear: External ear normal.  ?   Left Ear: External ear normal.  ?   Nose: Nose normal. No congestion or rhinorrhea.  ?   Mouth/Throat:   ?   Mouth: Mucous membranes are moist.  ?Eyes:  ?   General:     ?   Right eye: No discharge.     ?   Left eye: No discharge.  ?   Extraocular Movements: Extraocular movements intact.  ?   Conjunctiva/sclera: Conjunctivae normal.  ?   Pupils: Pupils are equal, round, and reactive to light.  ?Neck:  ? ?Cardiovascular:  ?   Rate and Rhythm: Normal rate and regular rhythm.  ?   Heart sounds: No murmur heard. ?Pulmonary:  ?   Effort: Pulmonary effort is normal. No respiratory distress.  ?   Breath sounds: Normal breath sounds. No wheezing, rhonchi or rales.  ?Abdominal:  ?   General: Abdomen is flat. Bowel sounds are normal.  ?Musculoskeletal:  ?   Cervical back: Normal range of motion and neck supple.  ?Skin: ?   General: Skin is warm and dry.  ?   Capillary Refill: Capillary refill takes less than 2 seconds.  ?Neurological:  ?   General: No focal deficit present.  ?   Mental Status: He is alert and oriented to person, place, and time.  ?Psychiatric:     ?   Mood and Affect: Mood normal.     ?     Behavior: Behavior normal.     ?   Thought Content: Thought content normal.     ?   Judgment: Judgment normal.  ? ? ?Results for orders placed or performed in visit on 08/07/21  ?TSH  ?Result Value Ref Range  ? TSH 2.740 0.450 - 4.500 uIU/mL  ?PSA  ?Result Value Ref Range  ? Prostate Specific Ag, Serum 1.9 0.0 - 4.0 ng/mL  ?Lipid panel  ?Result Value Ref Range  ? Cholesterol, Total 158 100 - 199 mg/dL  ? Triglycerides 155 (H) 0 - 149 mg/dL  ? HDL 29 (L) >39 mg/dL  ? VLDL Cholesterol Cal 28 5 - 40 mg/dL  ? LDL Chol Calc (NIH) 101 (H) 0 - 99 mg/dL  ? Chol/HDL Ratio 5.4 (H) 0.0 - 5.0 ratio  ?CBC with Differential/Platelet  ?Result Value Ref Range  ? WBC 5.8 3.4 - 10.8 x10E3/uL  ? RBC 5.75 4.14 - 5.80 x10E6/uL  ? Hemoglobin 16.9 13.0 - 17.7 g/dL  ? Hematocrit 49.8 37.5 - 51.0 %  ? MCV 87 79 - 97 fL  ? MCH 29.4 26.6 - 33.0 pg  ? MCHC 33.9 31.5 - 35.7 g/dL  ? RDW 13.1 11.6 - 15.4 %  ? Platelets 287 150 - 450 x10E3/uL  ? Neutrophils  65 Not Estab. %  ? Lymphs 25 Not Estab. %  ? Monocytes 8 Not Estab. %  ? Eos 1 Not Estab. %  ? Basos 1 Not Estab. %  ? Neutrophils Absolute 3.7 1.4 - 7.0 x10E3/uL  ? Lymphocytes Absolute 1.4 0.7 - 3.1 x10E3/uL  ? Monocytes Absolute 0.5 0.1 - 0.9 x10E3/uL  ? EOS (ABSOLUTE) 0.1 0.0 - 0.4 x10E3/uL  ? Basophils Absolute 0.1 0.0 - 0.2 x10E3/uL  ? Immature Granulocytes 0 Not Estab. %  ? Immature Grans (Abs) 0.0 0.0 - 0.1 x10E3/uL  ?Comprehensive metabolic panel  ?Result Value Ref Range  ? Glucose 112 (H) 70 - 99 mg/dL  ? BUN 13 6 - 24 mg/dL  ? Creatinine, Ser 0.97 0.76 - 1.27 mg/dL  ? eGFR 95 >59 mL/min/1.73  ? BUN/Creatinine Ratio 13 9 - 20  ? Sodium 136 134 - 144 mmol/L  ? Potassium 4.3 3.5 - 5.2 mmol/L  ? Chloride 99 96 - 106 mmol/L  ? CO2 20 20 - 29 mmol/L  ? Calcium 9.9 8.7 - 10.2 mg/dL  ? Total Protein 6.9 6.0 - 8.5 g/dL  ? Albumin 4.7 3.8 - 4.9 g/dL  ? Globulin, Total 2.2 1.5 - 4.5 g/dL  ? Albumin/Globulin Ratio 2.1 1.2 - 2.2  ? Bilirubin Total 0.6 0.0 - 1.2 mg/dL  ? Alkaline Phosphatase 73 44 - 121 IU/L  ? AST 37 0 - 40 IU/L  ? ALT 64 (H) 0 - 44 IU/L  ?Urinalysis, Routine w reflex microscopic  ?Result Value Ref Range  ? Specific Gravity, UA 1.020 1.005 - 1.030  ? pH, UA 7.0 5.0 - 7.5  ? Color, UA Yellow Yellow  ? Appearance Ur Clear Clear  ? Leukocytes,UA Negative Negative  ? Protein,UA Negative Negative/Trace  ? Glucose, UA Negative Negative  ? Ketones, UA Negative Negative  ? RBC, UA Negative Negative  ? Bilirubin, UA Negative Negative  ? Urobilinogen, Ur 0.2 0.2 - 1.0 mg/dL  ? Nitrite, UA Negative Negative  ?T4, free  ?Result Value Ref Range  ? Free T4 1.37 0.82 - 1.77 ng/dL  ?HgB A1c  ?Result Value Ref Range  ? Hgb A1c MFr Bld 5.8 (H) 4.8 - 5.6 %  ?   Est. average glucose Bld gHb Est-mCnc 120 mg/dL  ? ?   ?Assessment & Plan:  ? ?Problem List Items Addressed This Visit   ?None ?Visit Diagnoses   ? ? Muscle spasm    -  Primary  ? Will refill flexeril and give Motrin PRN for pain. Neck exercises given to help with  muscle spams.  Can refer to Physical Therapy if symptoms do not improve.  ? ?  ?  ? ?Follow up plan: ?Return if symptoms worsen or fail to improve. ? ? ? ? ? ?

## 2022-01-24 ENCOUNTER — Other Ambulatory Visit: Payer: Self-pay | Admitting: Nurse Practitioner

## 2022-01-24 NOTE — Telephone Encounter (Signed)
Requested medication (s) are due for refill today: Yes ? ?Requested medication (s) are on the active medication list: Yes ? ?Last refill:  12/20/21 ? ?Future visit scheduled: Yes ? ?Notes to clinic:  See request. ? ? ? ?Requested Prescriptions  ?Pending Prescriptions Disp Refills  ? cyclobenzaprine (FLEXERIL) 10 MG tablet [Pharmacy Med Name: CYCLOBENZAPRINE 10 MG TABLET] 30 tablet 0  ?  Sig: Take 1 tablet (10 mg total) by mouth 3 (three) times daily as needed for muscle spasms.  ?  ? Not Delegated - Analgesics:  Muscle Relaxants Failed - 01/24/2022 10:27 AM  ?  ?  Failed - This refill cannot be delegated  ?  ?  Passed - Valid encounter within last 6 months  ?  Recent Outpatient Visits   ? ?      ? 1 month ago Muscle spasm  ? [facility] Larae Grooms, NP  ? 3 months ago Muscle spasm  ? [facility] Larae Grooms, NP  ? 5 months ago Annual physical exam  ? [facility] Larae Grooms, NP  ? 11 months ago Essential hypertension  ? [facility] Larae Grooms, NP  ? 1 year ago Essential hypertension  ? [facility] Larae Grooms, NP  ? ?  ?  ?Future Appointments   ? ?        ? In 1 week Larae Grooms, NP [facility]  ? ?  ? ?  ?  ?  ? ?

## 2022-02-06 ENCOUNTER — Ambulatory Visit: Payer: 59 | Admitting: Nurse Practitioner

## 2022-03-28 ENCOUNTER — Telehealth: Payer: Self-pay

## 2022-03-29 ENCOUNTER — Ambulatory Visit: Payer: Self-pay | Admitting: Nurse Practitioner

## 2022-03-29 NOTE — Telephone Encounter (Signed)
error 

## 2022-04-08 ENCOUNTER — Other Ambulatory Visit: Payer: Self-pay | Admitting: Nurse Practitioner

## 2022-04-08 DIAGNOSIS — I10 Essential (primary) hypertension: Secondary | ICD-10-CM

## 2022-04-10 NOTE — Telephone Encounter (Signed)
Courtesy supply sent. Patient overdue for follow up

## 2022-04-12 ENCOUNTER — Other Ambulatory Visit: Payer: Self-pay | Admitting: Nurse Practitioner

## 2022-04-12 NOTE — Telephone Encounter (Signed)
Duplicate request. Requested Prescriptions  Pending Prescriptions Disp Refills  . metoprolol tartrate (LOPRESSOR) 25 MG tablet [Pharmacy Med Name: METOPROLOL TARTRATE 25 MG TAB] 18 tablet 0    Sig: Take 1 tablet (25 mg total) by mouth daily.     Cardiovascular:  Beta Blockers Passed - 04/12/2022 10:23 AM      Passed - Last BP in normal range    BP Readings from Last 1 Encounters:  12/20/21 122/73         Passed - Last Heart Rate in normal range    Pulse Readings from Last 1 Encounters:  12/20/21 71         Passed - Valid encounter within last 6 months    Recent Outpatient Visits          3 months ago Muscle spasm   [facility] Larae Grooms, NP   6 months ago Muscle spasm   [facility] Larae Grooms, NP   8 months ago Annual physical exam   [facility] Larae Grooms, NP   1 year ago Essential hypertension   [facility] Larae Grooms, NP   1 year ago Essential hypertension   [facility] Larae Grooms, NP      Future Appointments            In 2 weeks Larae Grooms, NP [facility]

## 2022-04-26 ENCOUNTER — Encounter: Payer: Self-pay | Admitting: Nurse Practitioner

## 2022-04-26 ENCOUNTER — Ambulatory Visit: Payer: Commercial Managed Care - PPO | Admitting: Nurse Practitioner

## 2022-04-26 VITALS — BP 134/87 | HR 55 | Temp 98.5°F | Wt 270.0 lb

## 2022-04-26 DIAGNOSIS — R7303 Prediabetes: Secondary | ICD-10-CM

## 2022-04-26 DIAGNOSIS — I10 Essential (primary) hypertension: Secondary | ICD-10-CM | POA: Diagnosis not present

## 2022-04-26 DIAGNOSIS — E038 Other specified hypothyroidism: Secondary | ICD-10-CM

## 2022-04-26 DIAGNOSIS — E782 Mixed hyperlipidemia: Secondary | ICD-10-CM

## 2022-04-26 MED ORDER — AMLODIPINE BESYLATE 5 MG PO TABS
5.0000 mg | ORAL_TABLET | Freq: Every day | ORAL | 1 refills | Status: DC
Start: 2022-04-26 — End: 2022-10-28

## 2022-04-26 MED ORDER — SILDENAFIL CITRATE 20 MG PO TABS: ORAL_TABLET | ORAL | 12 refills | Status: DC

## 2022-04-26 MED ORDER — ROSUVASTATIN CALCIUM 10 MG PO TABS: 10.0000 mg | ORAL_TABLET | Freq: Every day | ORAL | 1 refills | Status: DC

## 2022-04-26 MED ORDER — METOPROLOL TARTRATE 25 MG PO TABS: 25.0000 mg | ORAL_TABLET | Freq: Every day | ORAL | 1 refills | Status: DC

## 2022-04-26 MED ORDER — HYDROCHLOROTHIAZIDE 25 MG PO TABS: 25.0000 mg | ORAL_TABLET | Freq: Every day | ORAL | 1 refills | Status: DC

## 2022-04-26 MED ORDER — LOSARTAN POTASSIUM 100 MG PO TABS: 100.0000 mg | ORAL_TABLET | Freq: Every day | ORAL | 1 refills | Status: DC

## 2022-04-26 MED ORDER — SILDENAFIL CITRATE 50 MG PO TABS: 50.0000 mg | ORAL_TABLET | Freq: Every day | ORAL | 5 refills | Status: DC | PRN

## 2022-04-26 NOTE — Assessment & Plan Note (Signed)
Chronic.  Controlled.  Continue with current medication regimen with Metoprolol 25mg, Losartan 100mg, and Amlodipine 5mg.  Refills sent today.  Labs ordered today.  Return to clinic in 6 months for reevaluation.  Call sooner if concerns arise.   

## 2022-04-26 NOTE — Assessment & Plan Note (Signed)
Chronic.  Controlled.  Continue with current medication regimen of Crestor 10mg.  Labs ordered today.  Return to clinic in 6 months for reevaluation.  Call sooner if concerns arise.   

## 2022-04-26 NOTE — Assessment & Plan Note (Signed)
Labs ordered today.  Will make recommendations based on lab results. ?

## 2022-04-26 NOTE — Assessment & Plan Note (Signed)
Labs ordered today. Will make recommendations based on lab results. Controlled without medications.   

## 2022-04-26 NOTE — Progress Notes (Signed)
BP 134/87   Pulse (!) 55   Temp 98.5 F (36.9 C) (Oral)   Wt 270 lb (122.5 kg)   SpO2 95%   BMI 38.74 kg/m    Subjective:    Patient ID: Isaiah Terrell, male    DOB: 10/22/1970, 51 y.o.   MRN: [id number]  HPI: Isaiah Terrell is a 51 y.o. male  Chief Complaint  Patient presents with   Hyperlipidemia    6 month follow up    Diabetes   Hypertension   HYPERTENSION / HYPERLIPIDEMIA Satisfied with current treatment? no Duration of hypertension: years BP monitoring frequency: rarely BP range: 130/80 BP medication side effects: no Past BP meds: amlodipine, HCTZ and losartan (cozaar) Duration of hyperlipidemia: years Cholesterol medication side effects: no Cholesterol supplements: none Past cholesterol medications: none Medication compliance:  none Aspirin: no Recent stressors: no Recurrent headaches: no Visual changes: no Palpitations: no Dyspnea: no Chest pain: no Lower extremity edema: no Dizzy/lightheaded: no  The 10-year ASCVD risk score (Arnett DK, et al., 2019) is: 6.7%   Values used to calculate the score:     Age: 44 years     Sex: Male     Is Non-Hispanic African American: No     Diabetic: No     Tobacco smoker: No     Systolic Blood Pressure: 390 mmHg     Is BP treated: Yes     HDL Cholesterol: 29 mg/dL     Total Cholesterol: 158 mg/dL  Relevant past medical, surgical, family and social history reviewed and updated as indicated. Interim medical history since our last visit reviewed. Allergies and medications reviewed and updated.  Review of Systems  Eyes:  Negative for visual disturbance.  Respiratory:  Negative for chest tightness and shortness of breath.   Cardiovascular:  Negative for chest pain, palpitations and leg swelling.  Neurological:  Negative for dizziness, light-headedness and headaches.    Per HPI unless specifically indicated above     Objective:    BP 134/87   Pulse (!) 55   Temp 98.5 F (36.9 C) (Oral)   Wt 270 lb (122.5 kg)    SpO2 95%   BMI 38.74 kg/m   Wt Readings from Last 3 Encounters:  04/26/22 270 lb (122.5 kg)  12/20/21 266 lb 6.4 oz (120.8 kg)  10/10/21 265 lb 3.2 oz (120.3 kg)    Physical Exam Vitals and nursing note reviewed.  Constitutional:      General: He is not in acute distress.    Appearance: Normal appearance. He is not ill-appearing, toxic-appearing or diaphoretic.  HENT:     Head: Normocephalic.     Right Ear: External ear normal.     Left Ear: External ear normal.     Nose: Nose normal. No congestion or rhinorrhea.     Mouth/Throat:     Mouth: Mucous membranes are moist.  Eyes:     General:        Right eye: No discharge.        Left eye: No discharge.     Extraocular Movements: Extraocular movements intact.     Conjunctiva/sclera: Conjunctivae normal.     Pupils: Pupils are equal, round, and reactive to light.  Cardiovascular:     Rate and Rhythm: Normal rate and regular rhythm.     Heart sounds: No murmur heard. Pulmonary:     Effort: Pulmonary effort is normal. No respiratory distress.     Breath sounds: Normal breath sounds. No wheezing, rhonchi or  rales.  Abdominal:     General: Abdomen is flat. Bowel sounds are normal.  Musculoskeletal:     Cervical back: Normal range of motion and neck supple.  Skin:    General: Skin is warm and dry.     Capillary Refill: Capillary refill takes less than 2 seconds.  Neurological:     General: No focal deficit present.     Mental Status: He is alert and oriented to person, place, and time.  Psychiatric:        Mood and Affect: Mood normal.        Behavior: Behavior normal.        Thought Content: Thought content normal.        Judgment: Judgment normal.     Results for orders placed or performed in visit on 08/07/21  TSH  Result Value Ref Range   TSH 2.740 0.450 - 4.500 uIU/mL  PSA  Result Value Ref Range   Prostate Specific Ag, Serum 1.9 0.0 - 4.0 ng/mL  Lipid panel  Result Value Ref Range   Cholesterol, Total 158 100  - 199 mg/dL   Triglycerides 155 (H) 0 - 149 mg/dL   HDL 29 (L) >39 mg/dL   VLDL Cholesterol Cal 28 5 - 40 mg/dL   LDL Chol Calc (NIH) 101 (H) 0 - 99 mg/dL   Chol/HDL Ratio 5.4 (H) 0.0 - 5.0 ratio  CBC with Differential/Platelet  Result Value Ref Range   WBC 5.8 3.4 - 10.8 x10E3/uL   RBC 5.75 4.14 - 5.80 x10E6/uL   Hemoglobin 16.9 13.0 - 17.7 g/dL   Hematocrit 49.8 37.5 - 51.0 %   MCV 87 79 - 97 fL   MCH 29.4 26.6 - 33.0 pg   MCHC 33.9 31.5 - 35.7 g/dL   RDW 13.1 11.6 - 15.4 %   Platelets 287 150 - 450 x10E3/uL   Neutrophils 65 Not Estab. %   Lymphs 25 Not Estab. %   Monocytes 8 Not Estab. %   Eos 1 Not Estab. %   Basos 1 Not Estab. %   Neutrophils Absolute 3.7 1.4 - 7.0 x10E3/uL   Lymphocytes Absolute 1.4 0.7 - 3.1 x10E3/uL   Monocytes Absolute 0.5 0.1 - 0.9 x10E3/uL   EOS (ABSOLUTE) 0.1 0.0 - 0.4 x10E3/uL   Basophils Absolute 0.1 0.0 - 0.2 x10E3/uL   Immature Granulocytes 0 Not Estab. %   Immature Grans (Abs) 0.0 0.0 - 0.1 x10E3/uL  Comprehensive metabolic panel  Result Value Ref Range   Glucose 112 (H) 70 - 99 mg/dL   BUN 13 6 - 24 mg/dL   Creatinine, Ser 0.97 0.76 - 1.27 mg/dL   eGFR 95 >59 mL/min/1.73   BUN/Creatinine Ratio 13 9 - 20   Sodium 136 134 - 144 mmol/L   Potassium 4.3 3.5 - 5.2 mmol/L   Chloride 99 96 - 106 mmol/L   CO2 20 20 - 29 mmol/L   Calcium 9.9 8.7 - 10.2 mg/dL   Total Protein 6.9 6.0 - 8.5 g/dL   Albumin 4.7 3.8 - 4.9 g/dL   Globulin, Total 2.2 1.5 - 4.5 g/dL   Albumin/Globulin Ratio 2.1 1.2 - 2.2   Bilirubin Total 0.6 0.0 - 1.2 mg/dL   Alkaline Phosphatase 73 44 - 121 IU/L   AST 37 0 - 40 IU/L   ALT 64 (H) 0 - 44 IU/L  Urinalysis, Routine w reflex microscopic  Result Value Ref Range   Specific Gravity, UA 1.020 1.005 - 1.030   pH,  UA 7.0 5.0 - 7.5   Color, UA Yellow Yellow   Appearance Ur Clear Clear   Leukocytes,UA Negative Negative   Protein,UA Negative Negative/Trace   Glucose, UA Negative Negative   Ketones, UA Negative Negative    RBC, UA Negative Negative   Bilirubin, UA Negative Negative   Urobilinogen, Ur 0.2 0.2 - 1.0 mg/dL   Nitrite, UA Negative Negative  T4, free  Result Value Ref Range   Free T4 1.37 0.82 - 1.77 ng/dL  HgB A1c  Result Value Ref Range   Hgb A1c MFr Bld 5.8 (H) 4.8 - 5.6 %   Est. average glucose Bld gHb Est-mCnc 120 mg/dL      Assessment & Plan:   Problem List Items Addressed This Visit       Cardiovascular and Mediastinum   Essential hypertension - Primary    Chronic.  Controlled.  Continue with current medication regimen with Metoprolol 56m, Losartan 1074m and Amlodipine 11m81m Refills sent today.  Labs ordered today.  Return to clinic in 6 months for reevaluation.  Call sooner if concerns arise.        Relevant Medications   amLODipine (NORVASC) 5 MG tablet   hydrochlorothiazide (HYDRODIURIL) 25 MG tablet   losartan (COZAAR) 100 MG tablet   metoprolol tartrate (LOPRESSOR) 25 MG tablet   rosuvastatin (CRESTOR) 10 MG tablet   sildenafil (VIAGRA) 50 MG tablet   Other Relevant Orders   Comp Met (CMET)     Endocrine   Subclinical hypothyroidism    Labs ordered today. Will make recommendations based on lab results. Controlled without medications.      Relevant Medications   metoprolol tartrate (LOPRESSOR) 25 MG tablet   Other Relevant Orders   TSH   T4, free     Other   Hyperlipidemia    Chronic.  Controlled.  Continue with current medication regimen of Crestor 35m30mLabs ordered today.  Return to clinic in 6 months for reevaluation.  Call sooner if concerns arise.        Relevant Medications   amLODipine (NORVASC) 5 MG tablet   hydrochlorothiazide (HYDRODIURIL) 25 MG tablet   losartan (COZAAR) 100 MG tablet   metoprolol tartrate (LOPRESSOR) 25 MG tablet   rosuvastatin (CRESTOR) 10 MG tablet   sildenafil (VIAGRA) 50 MG tablet   Other Relevant Orders   Lipid Profile   Prediabetes    Labs ordered today. Will make recommendations based on lab results.        Relevant Orders   HgB A1c     Follow up plan: Return in about 6 months (around 10/27/2022) for Physical and Fasting labs.

## 2022-04-27 LAB — COMPREHENSIVE METABOLIC PANEL
ALT: 66 IU/L — ABNORMAL HIGH (ref 0–44)
AST: 41 IU/L — ABNORMAL HIGH (ref 0–40)
Albumin/Globulin Ratio: 2 (ref 1.2–2.2)
Albumin: 5 g/dL — ABNORMAL HIGH (ref 3.8–4.9)
Alkaline Phosphatase: 73 IU/L (ref 44–121)
BUN/Creatinine Ratio: 16 (ref 9–20)
BUN: 16 mg/dL (ref 6–24)
Bilirubin Total: 1 mg/dL (ref 0.0–1.2)
CO2: 22 mmol/L (ref 20–29)
Calcium: 10.5 mg/dL — ABNORMAL HIGH (ref 8.7–10.2)
Chloride: 97 mmol/L (ref 96–106)
Creatinine, Ser: 1.01 mg/dL (ref 0.76–1.27)
Globulin, Total: 2.5 g/dL (ref 1.5–4.5)
Glucose: 89 mg/dL (ref 70–99)
Potassium: 4.3 mmol/L (ref 3.5–5.2)
Sodium: 138 mmol/L (ref 134–144)
Total Protein: 7.5 g/dL (ref 6.0–8.5)
eGFR: 89 mL/min/{1.73_m2} (ref >59)

## 2022-04-27 LAB — HEMOGLOBIN A1C
Est. average glucose Bld gHb Est-mCnc: 126 mg/dL
Hgb A1c MFr Bld: 6 % — ABNORMAL HIGH (ref 4.8–5.6)

## 2022-04-27 LAB — LIPID PANEL
Chol/HDL Ratio: 4.4 ratio (ref 0.0–5.0)
Cholesterol, Total: 137 mg/dL (ref 100–199)
HDL: 31 mg/dL — ABNORMAL LOW (ref >39)
LDL Chol Calc (NIH): 64 mg/dL (ref 0–99)
Triglycerides: 262 mg/dL — ABNORMAL HIGH (ref 0–149)
VLDL Cholesterol Cal: 42 mg/dL — ABNORMAL HIGH (ref 5–40)

## 2022-04-27 LAB — T4, FREE: Free T4: 1.24 ng/dL (ref 0.82–1.77)

## 2022-04-27 LAB — TSH: TSH: 4.09 u[IU]/mL (ref 0.450–4.500)

## 2022-04-29 NOTE — Progress Notes (Signed)
Hi Isaiah Terrell. It was nice to see you yesterday.  Your lab work looks good.  Your liver enzymes are slightly elevated.  We will continue to monitor them.  Your Triglycerides are elevated.  I recommend decreasing processed foods and refined sugar intake.  A1c is well controlled at 6.0.  Continue with your current medication regimen.  Follow up as discussed.  Please let me know if you have any questions.

## 2022-08-27 ENCOUNTER — Ambulatory Visit: Payer: Self-pay | Admitting: *Deleted

## 2022-08-27 NOTE — Telephone Encounter (Signed)
I squeezed him in for tomorrwo at 1 PM .

## 2022-08-27 NOTE — Telephone Encounter (Signed)
  Chief Complaint: nose bleeds on and off since last week  Symptoms: nose bleeds x 2 per day started last Wednesday - Friday stopped over w/e and started back Monday and today. Lasting 10-15 minutes. Clots noted at times soaking 2 paper towels. Reports feeling lightheaded on Saturday denies today.  Frequency: last week  Pertinent Negatives: Patient denies lightheadedness. No nose bleed now  Disposition: [] ED /[x] Urgent Care (no appt availability in office) / [] Appointment(In office/virtual)/ []  [facility]/ [] Home Care/ [] Refused Recommended Disposition /[] [facility]/ []  Follow-up with PCP Additional Notes:   Recommended UC and do not feel patient will go ,  due to no available appt with PCP within 24 hours. Patient is reluctant to see any other providers. Please advise.     Reason for Disposition  [1] Bleeding recurs 3 or more times in 24 hours AND [2] direct pressure applied correctly    Bleeding occurred 2 times in 24 hour period  Answer Assessment - Initial Assessment Questions 1. AMOUNT OF BLEEDING: "How bad is the bleeding?" "How much blood was lost?" "Has the bleeding stopped?"   - MILD: needed a couple tissues   - MODERATE: needed many tissues   - SEVERE: large blood clots, soaked many tissues, lasted more than 30 minutes      Has seen large clots at times going through 2 paper towels. No bleeding now  2. ONSET: "When did the nosebleed start?"      Last wednesday Thursday  Friday over w/e stopped and started back yesterday  3. FREQUENCY: "How many nosebleeds have you had in the last 24 hours?"      2 4. RECURRENT SYMPTOMS: "Have there been other recent nosebleeds?" If Yes, ask: "How long did it take you to stop the bleeding?" "What worked best?"      Today 10-15 minutes 5. CAUSE: "What do you think caused this nosebleed?"     Not sure  6. LOCAL FACTORS: "Do you have any cold symptoms?", "Have you been rubbing or picking at your nose?"     no 7. SYSTEMIC  FACTORS: "Do you have high blood pressure or any bleeding problems?"     Na 8. BLOOD THINNERS: "Do you take any blood thinners?" (e.g., aspirin, clopidogrel / Plavix, coumadin, heparin). Notes: Other strong blood thinners include: Arixtra (fondaparinux), Eliquis (apixaban), Pradaxa (dabigatran), and Xarelto (rivaroxaban).     no 9. OTHER SYMPTOMS: "Do you have any other symptoms?" (e.g., lightheadedness)     Lightheaded spells Saturday denies lightheaded now no headaches  10. PREGNANCY: "Is there any chance you are pregnant?" "When was your last menstrual period?"       na  Protocols used: Nosebleed-A-AH

## 2022-08-28 ENCOUNTER — Ambulatory Visit: Payer: Commercial Managed Care - PPO | Admitting: Nurse Practitioner

## 2022-08-28 ENCOUNTER — Encounter: Payer: Self-pay | Admitting: Nurse Practitioner

## 2022-08-28 VITALS — BP 122/81 | HR 60 | Temp 98.6°F | Wt 260.1 lb

## 2022-08-28 DIAGNOSIS — Z23 Encounter for immunization: Secondary | ICD-10-CM

## 2022-08-28 DIAGNOSIS — R040 Epistaxis: Secondary | ICD-10-CM

## 2022-08-28 NOTE — Progress Notes (Signed)
BP 122/81   Pulse 60   Temp 98.6 F (37 C) (Oral)   Wt 260 lb 1.6 oz (118 kg)   SpO2 98%   BMI 37.32 kg/m    Subjective:    Patient ID: Isaiah Terrell, male    DOB: [birth year], 51 y.o.   MRN: [id number]  HPI: Isaiah Terrell is a 51 y.o. male  Chief Complaint  Patient presents with   Epistaxis    Nose bleeds x 1 week.    Patient states he has been having nose bleeds since last week.  Has had some light headedness, dizziness.  None during visit today.  Having clots, lasting 10-15 minutes a couple times per day.     Relevant past medical, surgical, family and social history reviewed and updated as indicated. Interim medical history since our last visit reviewed. Allergies and medications reviewed and updated.  Review of Systems  HENT:  Positive for nosebleeds.   Neurological:  Positive for dizziness and light-headedness.    Per HPI unless specifically indicated above     Objective:    BP 122/81   Pulse 60   Temp 98.6 F (37 C) (Oral)   Wt 260 lb 1.6 oz (118 kg)   SpO2 98%   BMI 37.32 kg/m   Wt Readings from Last 3 Encounters:  08/28/22 260 lb 1.6 oz (118 kg)  04/26/22 270 lb (122.5 kg)  12/20/21 266 lb 6.4 oz (120.8 kg)    Physical Exam Vitals and nursing note reviewed.  Constitutional:      General: He is not in acute distress.    Appearance: Normal appearance. He is not ill-appearing, toxic-appearing or diaphoretic.  HENT:     Head: Normocephalic.     Right Ear: External ear normal.     Left Ear: External ear normal.     Nose: Nose normal. No congestion or rhinorrhea.     Mouth/Throat:     Mouth: Mucous membranes are moist.  Eyes:     General:        Right eye: No discharge.        Left eye: No discharge.     Extraocular Movements: Extraocular movements intact.     Conjunctiva/sclera: Conjunctivae normal.     Pupils: Pupils are equal, round, and reactive to light.  Cardiovascular:     Rate and Rhythm: Normal rate and regular rhythm.     Heart sounds:  No murmur heard. Pulmonary:     Effort: Pulmonary effort is normal. No respiratory distress.     Breath sounds: Normal breath sounds. No wheezing, rhonchi or rales.  Abdominal:     General: Abdomen is flat. Bowel sounds are normal.  Musculoskeletal:     Cervical back: Normal range of motion and neck supple.  Skin:    General: Skin is warm and dry.     Capillary Refill: Capillary refill takes less than 2 seconds.  Neurological:     General: No focal deficit present.     Mental Status: He is alert and oriented to person, place, and time.  Psychiatric:        Mood and Affect: Mood normal.        Behavior: Behavior normal.        Thought Content: Thought content normal.        Judgment: Judgment normal.     Results for orders placed or performed in visit on 04/26/22  Comp Met (CMET)  Result Value Ref Range   Glucose 89 70 -  99 mg/dL   BUN 16 6 - 24 mg/dL   Creatinine, Ser 1.01 0.76 - 1.27 mg/dL   eGFR 89 >59 mL/min/1.73   BUN/Creatinine Ratio 16 9 - 20   Sodium 138 134 - 144 mmol/L   Potassium 4.3 3.5 - 5.2 mmol/L   Chloride 97 96 - 106 mmol/L   CO2 22 20 - 29 mmol/L   Calcium 10.5 (H) 8.7 - 10.2 mg/dL   Total Protein 7.5 6.0 - 8.5 g/dL   Albumin 5.0 (H) 3.8 - 4.9 g/dL   Globulin, Total 2.5 1.5 - 4.5 g/dL   Albumin/Globulin Ratio 2.0 1.2 - 2.2   Bilirubin Total 1.0 0.0 - 1.2 mg/dL   Alkaline Phosphatase 73 44 - 121 IU/L   AST 41 (H) 0 - 40 IU/L   ALT 66 (H) 0 - 44 IU/L  TSH  Result Value Ref Range   TSH 4.090 0.450 - 4.500 uIU/mL  T4, free  Result Value Ref Range   Free T4 1.24 0.82 - 1.77 ng/dL  Lipid Profile  Result Value Ref Range   Cholesterol, Total 137 100 - 199 mg/dL   Triglycerides 262 (H) 0 - 149 mg/dL   HDL 31 (L) >39 mg/dL   VLDL Cholesterol Cal 42 (H) 5 - 40 mg/dL   LDL Chol Calc (NIH) 64 0 - 99 mg/dL   Chol/HDL Ratio 4.4 0.0 - 5.0 ratio  HgB A1c  Result Value Ref Range   Hgb A1c MFr Bld 6.0 (H) 4.8 - 5.6 %   Est. average glucose Bld gHb Est-mCnc  126 mg/dL      Assessment & Plan:   Problem List Items Addressed This Visit   None Visit Diagnoses     Bleeding nose    -  Primary   Ongoing x several days.  Will check CBC.  Urgent referral placed for ENT.   Relevant Orders   CBC w/Diff   Ambulatory referral to ENT   Need for influenza vaccination       Relevant Orders   Flu Vaccine QUAD 6+ mos PF IM (Fluarix Quad PF)        Follow up plan: Return if symptoms worsen or fail to improve.

## 2022-08-29 LAB — CBC WITH DIFFERENTIAL/PLATELET
Basophils Absolute: 0.1 10*3/uL (ref 0.0–0.2)
Basos: 1 %
EOS (ABSOLUTE): 0 10*3/uL (ref 0.0–0.4)
Eos: 0 %
Hematocrit: 48.2 % (ref 37.5–51.0)
Hemoglobin: 16.3 g/dL (ref 13.0–17.7)
Immature Grans (Abs): 0 10*3/uL (ref 0.0–0.1)
Immature Granulocytes: 0 %
Lymphocytes Absolute: 1.8 10*3/uL (ref 0.7–3.1)
Lymphs: 28 %
MCH: 29.6 pg (ref 26.6–33.0)
MCHC: 33.8 g/dL (ref 31.5–35.7)
MCV: 88 fL (ref 79–97)
Monocytes Absolute: 0.4 10*3/uL (ref 0.1–0.9)
Monocytes: 6 %
Neutrophils Absolute: 4 10*3/uL (ref 1.4–7.0)
Neutrophils: 65 %
Platelets: 280 10*3/uL (ref 150–450)
RBC: 5.51 x10E6/uL (ref 4.14–5.80)
RDW: 12.9 % (ref 11.6–15.4)
WBC: 6.3 10*3/uL (ref 3.4–10.8)

## 2022-08-29 NOTE — Progress Notes (Signed)
Hi Isaiah Terrell.  Your lab work looks good.  No concerns with anemia at this time.  Make sure to drink plenty of water.

## 2022-10-28 ENCOUNTER — Encounter: Payer: Self-pay | Admitting: Nurse Practitioner

## 2022-10-28 ENCOUNTER — Ambulatory Visit (INDEPENDENT_AMBULATORY_CARE_PROVIDER_SITE_OTHER): Payer: Commercial Managed Care - PPO | Admitting: Nurse Practitioner

## 2022-10-28 VITALS — BP 126/79 | HR 62 | Temp 98.3°F | Ht 71.6 in | Wt 262.8 lb

## 2022-10-28 DIAGNOSIS — Z0000 Encounter for general adult medical examination without abnormal findings: Secondary | ICD-10-CM | POA: Diagnosis not present

## 2022-10-28 DIAGNOSIS — Z6835 Body mass index (BMI) 35.0-35.9, adult: Secondary | ICD-10-CM

## 2022-10-28 DIAGNOSIS — Z23 Encounter for immunization: Secondary | ICD-10-CM | POA: Diagnosis not present

## 2022-10-28 DIAGNOSIS — E038 Other specified hypothyroidism: Secondary | ICD-10-CM

## 2022-10-28 DIAGNOSIS — I10 Essential (primary) hypertension: Secondary | ICD-10-CM | POA: Diagnosis not present

## 2022-10-28 DIAGNOSIS — R7303 Prediabetes: Secondary | ICD-10-CM

## 2022-10-28 DIAGNOSIS — Z1211 Encounter for screening for malignant neoplasm of colon: Secondary | ICD-10-CM

## 2022-10-28 DIAGNOSIS — E782 Mixed hyperlipidemia: Secondary | ICD-10-CM

## 2022-10-28 DIAGNOSIS — E66812 Obesity, class 2: Secondary | ICD-10-CM

## 2022-10-28 LAB — URINALYSIS, ROUTINE W REFLEX MICROSCOPIC
Bilirubin, UA: NEGATIVE
Glucose, UA: NEGATIVE
Ketones, UA: NEGATIVE
Leukocytes,UA: NEGATIVE
Nitrite, UA: NEGATIVE
Protein,UA: NEGATIVE
RBC, UA: NEGATIVE
Specific Gravity, UA: <1.005 — ABNORMAL LOW (ref 1.005–1.030)
Urobilinogen, Ur: 0.2 mg/dL (ref 0.2–1.0)
pH, UA: 5.5 (ref 5.0–7.5)

## 2022-10-28 MED ORDER — SILDENAFIL CITRATE 50 MG PO TABS: 50.0000 mg | ORAL_TABLET | Freq: Every day | ORAL | 5 refills | Status: DC | PRN

## 2022-10-28 MED ORDER — ROSUVASTATIN CALCIUM 10 MG PO TABS: 10.0000 mg | ORAL_TABLET | Freq: Every day | ORAL | 1 refills | Status: DC

## 2022-10-28 MED ORDER — METOPROLOL TARTRATE 25 MG PO TABS: 25.0000 mg | ORAL_TABLET | Freq: Every day | ORAL | 1 refills | Status: DC

## 2022-10-28 MED ORDER — HYDROCHLOROTHIAZIDE 25 MG PO TABS: 25.0000 mg | ORAL_TABLET | Freq: Every day | ORAL | 1 refills | Status: DC

## 2022-10-28 MED ORDER — AMLODIPINE BESYLATE 5 MG PO TABS: 5.0000 mg | ORAL_TABLET | Freq: Every day | ORAL | 1 refills | Status: DC

## 2022-10-28 MED ORDER — LOSARTAN POTASSIUM 100 MG PO TABS: 100.0000 mg | ORAL_TABLET | Freq: Every day | ORAL | 1 refills | Status: DC

## 2022-10-28 NOTE — Assessment & Plan Note (Signed)
Chronic.  Controlled.  Continue with current medication regimen of Crestor 10mg  daily.  Labs ordered today.  Return to clinic in 6 months for reevaluation.  Call sooner if concerns arise.

## 2022-10-28 NOTE — Assessment & Plan Note (Signed)
Labs ordered today. Will make recommendations based on lab results. Controlled without medications.

## 2022-10-28 NOTE — Assessment & Plan Note (Signed)
Chronic.  Controlled.  Continue with current medication regimen with Metoprolol 25mg , Losartan 100mg , and Amlodipine 5mg .  Refills sent today.  Labs ordered today.  Return to clinic in 6 months for reevaluation.  Call sooner if concerns arise.

## 2022-10-28 NOTE — Assessment & Plan Note (Signed)
Recommended eating smaller high protein, low fat meals more frequently and exercising 30 mins a day 5 times a week with a goal of 10-15lb weight loss in the next 3 months.  

## 2022-10-28 NOTE — Progress Notes (Signed)
BP 126/79   Pulse 62   Temp 98.3 F (36.8 C) (Oral)   Ht 5' 11.6" (1.819 m)   Wt 262 lb 12.8 oz (119.2 kg)   SpO2 96%   BMI 36.04 kg/m    Subjective:    Patient ID: Isaiah Terrell, male    DOB: June 07, 1971, 52 y.o.   MRN: [id number]  HPI: Isaiah Terrell is a 52 y.o. male presenting on 10/28/2022 for comprehensive medical examination. Current medical complaints include:none  He currently lives with: Interim Problems from his last visit: no  HYPERTENSION / HYPERLIPIDEMIA Satisfied with current treatment? no Duration of hypertension: years BP monitoring frequency: weekly BP range: 130/80 BP medication side effects: no Past BP meds:  Metoprolol, amlodipine, HCTZ, and losartan (cozaar)- patient has been refilled Duration of hyperlipidemia: years Cholesterol medication side effects: no Cholesterol supplements: none Past cholesterol medications: rosuvastatin (crestor) Medication compliance: excellent compliance Aspirin: no Recent stressors: no Recurrent headaches: no Visual changes: no Palpitations: no Dyspnea: no Chest pain: no Lower extremity edema: no Dizzy/lightheaded: no   Depression Screen done today and results listed below:     10/28/2022    8:47 AM 08/28/2022    1:03 PM 04/26/2022    3:56 PM 08/07/2021    8:16 AM 07/10/2020    8:05 AM  Depression screen PHQ 2/9  Decreased Interest 0 0 0 0 0  Down, Depressed, Hopeless 0 0 0 0 0  PHQ - 2 Score 0 0 0 0 0  Altered sleeping 0 0 0 0   Tired, decreased energy 0 0 0 0   Change in appetite 0 0 0 0   Feeling bad or failure about yourself  0 0 0 0   Trouble concentrating 0 0 0 0   Moving slowly or fidgety/restless 0 0 0 0   Suicidal thoughts 0 0 0 0   PHQ-9 Score 0 0 0 0   Difficult doing work/chores Not difficult at all Not difficult at all Not difficult at all Not difficult at all     The patient does not have a history of falls. I did complete a risk assessment for falls. A plan of care for falls was  documented.   Past Medical History:  History reviewed. No pertinent past medical history.  Surgical History:  Past Surgical History:  Procedure Laterality Date   HERNIA REPAIR     HYDROCELE EXCISION / REPAIR     KNEE CARTILAGE SURGERY Left 2008    Medications:  Current Outpatient Medications on File Prior to Visit  Medication Sig   cyclobenzaprine (FLEXERIL) 10 MG tablet Take 1 tablet (10 mg total) by mouth 3 (three) times daily as needed for muscle spasms.   ibuprofen (ADVIL) 800 MG tablet Take 1 tablet (800 mg total) by mouth every 8 (eight) hours as needed.   triamcinolone cream (KENALOG) 0.1 % Apply 1 application topically 2 (two) times daily.   [DISCONTINUED] metoprolol succinate (TOPROL-XL) 25 MG 24 hr tablet Take 1 tablet (25 mg total) by mouth daily.   No current facility-administered medications on file prior to visit.    Allergies:  Allergies  Allergen Reactions   Benazepril Cough   Amlodipine Other (See Comments)    Mild edema with 10 mg dose does fine with 5mg     Social History:  Social History   Socioeconomic History   Marital status: Married    Spouse name: Not on file   Number of children: Not on file   Years of education:  Not on file   Highest education level: Not on file  Occupational History   Not on file  Tobacco Use   Smoking status: Never   Smokeless tobacco: Never  Vaping Use   Vaping Use: Never used  Substance and Sexual Activity   Alcohol use: No   Drug use: No   Sexual activity: Yes  Other Topics Concern   Not on file  Social History Narrative   Not on file   Social Determinants of Health   Financial Resource Strain: Not on file  Food Insecurity: Not on file  Transportation Needs: Not on file  Physical Activity: Not on file  Stress: Not on file  Social Connections: Not on file  Intimate Partner Violence: Not on file   Social History   Tobacco Use  Smoking Status Never  Smokeless Tobacco Never   Social History    Substance and Sexual Activity  Alcohol Use No    Family History:  Family History  Problem Relation Age of Onset   Cancer Mother        ovarian   Hypertension Mother    Hypertension Father    Stroke Father    Heart disease Father    Cancer Father        prostate    Past medical history, surgical history, medications, allergies, family history and social history reviewed with patient today and changes made to appropriate areas of the chart.   Review of Systems  Eyes:  Negative for blurred vision and double vision.  Respiratory:  Negative for shortness of breath.   Cardiovascular:  Negative for chest pain, palpitations and leg swelling.  Neurological:  Negative for dizziness and headaches.   All other ROS negative except what is listed above and in the HPI.      Objective:    BP 126/79   Pulse 62   Temp 98.3 F (36.8 C) (Oral)   Ht 5' 11.6" (1.819 m)   Wt 262 lb 12.8 oz (119.2 kg)   SpO2 96%   BMI 36.04 kg/m   Wt Readings from Last 3 Encounters:  10/28/22 262 lb 12.8 oz (119.2 kg)  08/28/22 260 lb 1.6 oz (118 kg)  04/26/22 270 lb (122.5 kg)    Physical Exam Vitals and nursing note reviewed.  Constitutional:      General: He is not in acute distress.    Appearance: Normal appearance. He is obese. He is not ill-appearing, toxic-appearing or diaphoretic.  HENT:     Head: Normocephalic.     Right Ear: Tympanic membrane, ear canal and external ear normal.     Left Ear: Tympanic membrane, ear canal and external ear normal.     Nose: Nose normal. No congestion or rhinorrhea.     Mouth/Throat:     Mouth: Mucous membranes are moist.  Eyes:     General:        Right eye: No discharge.        Left eye: No discharge.     Extraocular Movements: Extraocular movements intact.     Conjunctiva/sclera: Conjunctivae normal.     Pupils: Pupils are equal, round, and reactive to light.  Cardiovascular:     Rate and Rhythm: Normal rate and regular rhythm.     Heart sounds:  No murmur heard. Pulmonary:     Effort: Pulmonary effort is normal. No respiratory distress.     Breath sounds: Normal breath sounds. No wheezing, rhonchi or rales.  Abdominal:     General:  Abdomen is flat. Bowel sounds are normal. There is no distension.     Palpations: Abdomen is soft.     Tenderness: There is no abdominal tenderness. There is no guarding.  Musculoskeletal:     Cervical back: Normal range of motion and neck supple.  Skin:    General: Skin is warm and dry.     Capillary Refill: Capillary refill takes less than 2 seconds.  Neurological:     General: No focal deficit present.     Mental Status: He is alert and oriented to person, place, and time.     Cranial Nerves: No cranial nerve deficit.     Motor: No weakness.     Deep Tendon Reflexes: Reflexes normal.  Psychiatric:        Mood and Affect: Mood normal.        Behavior: Behavior normal.        Thought Content: Thought content normal.        Judgment: Judgment normal.     Results for orders placed or performed in visit on 08/28/22  CBC w/Diff  Result Value Ref Range   WBC 6.3 3.4 - 10.8 x10E3/uL   RBC 5.51 4.14 - 5.80 x10E6/uL   Hemoglobin 16.3 13.0 - 17.7 g/dL   Hematocrit 09.3 81.8 - 51.0 %   MCV 88 79 - 97 fL   MCH 29.6 26.6 - 33.0 pg   MCHC 33.8 31.5 - 35.7 g/dL   RDW 29.9 37.1 - 69.6 %   Platelets 280 150 - 450 x10E3/uL   Neutrophils 65 Not Estab. %   Lymphs 28 Not Estab. %   Monocytes 6 Not Estab. %   Eos 0 Not Estab. %   Basos 1 Not Estab. %   Neutrophils Absolute 4.0 1.4 - 7.0 x10E3/uL   Lymphocytes Absolute 1.8 0.7 - 3.1 x10E3/uL   Monocytes Absolute 0.4 0.1 - 0.9 x10E3/uL   EOS (ABSOLUTE) 0.0 0.0 - 0.4 x10E3/uL   Basophils Absolute 0.1 0.0 - 0.2 x10E3/uL   Immature Granulocytes 0 Not Estab. %   Immature Grans (Abs) 0.0 0.0 - 0.1 x10E3/uL      Assessment & Plan:   Problem List Items Addressed This Visit       Cardiovascular and Mediastinum   Essential hypertension    Chronic.   Controlled.  Continue with current medication regimen with Metoprolol 25mg , Losartan 100mg , and Amlodipine 5mg .  Refills sent today.  Labs ordered today.  Return to clinic in 6 months for reevaluation.  Call sooner if concerns arise.        Relevant Medications   amLODipine (NORVASC) 5 MG tablet   hydrochlorothiazide (HYDRODIURIL) 25 MG tablet   losartan (COZAAR) 100 MG tablet   metoprolol tartrate (LOPRESSOR) 25 MG tablet   rosuvastatin (CRESTOR) 10 MG tablet   sildenafil (VIAGRA) 50 MG tablet     Endocrine   Subclinical hypothyroidism    Labs ordered today. Will make recommendations based on lab results. Controlled without medications.        Relevant Medications   metoprolol tartrate (LOPRESSOR) 25 MG tablet   Other Relevant Orders   T4, free     Other   Obesity    Recommended eating smaller high protein, low fat meals more frequently and exercising 30 mins a day 5 times a week with a goal of 10-15lb weight loss in the next 3 months.       Hyperlipidemia    Chronic.  Controlled.  Continue with current medication  regimen of Crestor 10mg  daily.  Labs ordered today.  Return to clinic in 6 months for reevaluation.  Call sooner if concerns arise.        Relevant Medications   amLODipine (NORVASC) 5 MG tablet   hydrochlorothiazide (HYDRODIURIL) 25 MG tablet   losartan (COZAAR) 100 MG tablet   metoprolol tartrate (LOPRESSOR) 25 MG tablet   rosuvastatin (CRESTOR) 10 MG tablet   sildenafil (VIAGRA) 50 MG tablet   Other Relevant Orders   Lipid panel   Prediabetes    Labs ordered at visit today.  Will make recommendations based on lab results.        Relevant Orders   HgB A1c   Other Visit Diagnoses     Annual physical exam    -  Primary   Health maintenance reviewed during visit.  Labs ordered.  Shingles shot given.  Colonoscopy ordered.   Relevant Orders   TSH   PSA   Lipid panel   CBC with Differential/Platelet   Comprehensive metabolic panel   Urinalysis,  Routine w reflex microscopic   HgB A1c   Need for shingles vaccine       Relevant Orders   Zoster Recombinant (Shingrix )   Screening for colon cancer       Relevant Orders   Ambulatory referral to Gastroenterology        Discussed aspirin prophylaxis for myocardial infarction prevention and decision was it was not indicated  LABORATORY TESTING:  Health maintenance labs ordered today as discussed above.   The natural history of prostate cancer and ongoing controversy regarding screening and potential treatment outcomes of prostate cancer has been discussed with the patient. The meaning of a false positive PSA and a false negative PSA has been discussed. He indicates understanding of the limitations of this screening test and wishes to proceed with screening PSA testing.   IMMUNIZATIONS:   - Tdap: Tetanus vaccination status reviewed: last tetanus booster within 10 years. - Influenza: up to date - Pneumovax: Not applicable - Prevnar: Not applicable - HPV: Not applicable - Zostavax vaccine:  Discussed today  SCREENING: - Colonoscopy: Order given today  Discussed with patient purpose of the colonoscopy is to detect colon cancer at curable precancerous or early stages   - AAA Screening: Not applicable  -Hearing Test: Not applicable  -Spirometry: Not applicable   PATIENT COUNSELING:    Sexuality: Discussed sexually transmitted diseases, partner selection, use of condoms, avoidance of unintended pregnancy  and contraceptive alternatives.   Advised to avoid cigarette smoking.  I discussed with the patient that most people either abstain from alcohol or drink within safe limits (<=14/week and <=4 drinks/occasion for males, <=7/weeks and <= 3 drinks/occasion for females) and that the risk for alcohol disorders and other health effects rises proportionally with the number of drinks per week and how often a drinker exceeds daily limits.  Discussed cessation/primary prevention of drug  use and availability of treatment for abuse.   Diet: Encouraged to adjust caloric intake to maintain  or achieve ideal body weight, to reduce intake of dietary saturated fat and total fat, to limit sodium intake by avoiding high sodium foods and not adding table salt, and to maintain adequate dietary potassium and calcium preferably from fresh fruits, vegetables, and low-fat dairy products.    stressed the importance of regular exercise  Injury prevention: Discussed safety belts, safety helmets, smoke detector, smoking near bedding or upholstery.   Dental health: Discussed importance of regular tooth brushing, flossing, and  dental visits.   Follow up plan: NEXT PREVENTATIVE PHYSICAL DUE IN 1 YEAR. Return in about 5 months (around 03/29/2023) for HTN, HLD, DM2 FU.

## 2022-10-28 NOTE — Assessment & Plan Note (Signed)
Labs ordered at visit today.  Will make recommendations based on lab results.   

## 2022-10-29 LAB — CBC WITH DIFFERENTIAL/PLATELET
Basophils Absolute: 0.1 10*3/uL (ref 0.0–0.2)
Basos: 1 %
EOS (ABSOLUTE): 0.1 10*3/uL (ref 0.0–0.4)
Eos: 1 %
Hematocrit: 51.9 % — ABNORMAL HIGH (ref 37.5–51.0)
Hemoglobin: 17.3 g/dL (ref 13.0–17.7)
Immature Grans (Abs): 0 10*3/uL (ref 0.0–0.1)
Immature Granulocytes: 0 %
Lymphocytes Absolute: 1.8 10*3/uL (ref 0.7–3.1)
Lymphs: 25 %
MCH: 28.9 pg (ref 26.6–33.0)
MCHC: 33.3 g/dL (ref 31.5–35.7)
MCV: 87 fL (ref 79–97)
Monocytes Absolute: 0.5 10*3/uL (ref 0.1–0.9)
Monocytes: 7 %
Neutrophils Absolute: 5 10*3/uL (ref 1.4–7.0)
Neutrophils: 66 %
Platelets: 245 10*3/uL (ref 150–450)
RBC: 5.98 x10E6/uL — ABNORMAL HIGH (ref 4.14–5.80)
RDW: 13.2 % (ref 11.6–15.4)
WBC: 7.5 10*3/uL (ref 3.4–10.8)

## 2022-10-29 LAB — COMPREHENSIVE METABOLIC PANEL
ALT: 51 IU/L — ABNORMAL HIGH (ref 0–44)
AST: 29 IU/L (ref 0–40)
Albumin/Globulin Ratio: 2.3 — ABNORMAL HIGH (ref 1.2–2.2)
Albumin: 5.2 g/dL — ABNORMAL HIGH (ref 3.8–4.9)
Alkaline Phosphatase: 83 IU/L (ref 44–121)
BUN/Creatinine Ratio: 17 (ref 9–20)
BUN: 17 mg/dL (ref 6–24)
Bilirubin Total: 0.5 mg/dL (ref 0.0–1.2)
CO2: 18 mmol/L — ABNORMAL LOW (ref 20–29)
Calcium: 10.7 mg/dL — ABNORMAL HIGH (ref 8.7–10.2)
Chloride: 101 mmol/L (ref 96–106)
Creatinine, Ser: 1 mg/dL (ref 0.76–1.27)
Globulin, Total: 2.3 g/dL (ref 1.5–4.5)
Glucose: 106 mg/dL — ABNORMAL HIGH (ref 70–99)
Potassium: 4.5 mmol/L (ref 3.5–5.2)
Sodium: 139 mmol/L (ref 134–144)
Total Protein: 7.5 g/dL (ref 6.0–8.5)
eGFR: 90 mL/min/{1.73_m2} (ref >59)

## 2022-10-29 LAB — LIPID PANEL
Chol/HDL Ratio: 4.4 ratio (ref 0.0–5.0)
Cholesterol, Total: 153 mg/dL (ref 100–199)
HDL: 35 mg/dL — ABNORMAL LOW (ref >39)
LDL Chol Calc (NIH): 78 mg/dL (ref 0–99)
Triglycerides: 241 mg/dL — ABNORMAL HIGH (ref 0–149)
VLDL Cholesterol Cal: 40 mg/dL (ref 5–40)

## 2022-10-29 LAB — TSH: TSH: 3.02 u[IU]/mL (ref 0.450–4.500)

## 2022-10-29 LAB — HEMOGLOBIN A1C
Est. average glucose Bld gHb Est-mCnc: 123 mg/dL
Hgb A1c MFr Bld: 5.9 % — ABNORMAL HIGH (ref 4.8–5.6)

## 2022-10-29 LAB — PSA: Prostate Specific Ag, Serum: 1.7 ng/mL (ref 0.0–4.0)

## 2022-10-29 LAB — T4, FREE: Free T4: 1.28 ng/dL (ref 0.82–1.77)

## 2022-10-29 NOTE — Progress Notes (Signed)
Hi Isaiah Terrell. It was good to see you yesterday.  Your lab work shows that your triglycerides have improved.  Continue to decrease processed foods and refined sugar intake.  Your glucose is improved and A1c is well controlled at 5.9.  Your Calcium is elevated.  We will continue to monitor this.  Your liver enzymes have improved which is great.  Continue with your current medication regimen.  Follow up as discussed.

## 2023-04-01 ENCOUNTER — Ambulatory Visit: Payer: Commercial Managed Care - PPO | Admitting: Nurse Practitioner

## 2023-04-01 ENCOUNTER — Encounter: Payer: Self-pay | Admitting: Nurse Practitioner

## 2023-04-01 VITALS — BP 127/77 | HR 56 | Temp 98.6°F | Wt 260.6 lb

## 2023-04-01 DIAGNOSIS — Z23 Encounter for immunization: Secondary | ICD-10-CM | POA: Diagnosis not present

## 2023-04-01 DIAGNOSIS — R7303 Prediabetes: Secondary | ICD-10-CM

## 2023-04-01 DIAGNOSIS — E782 Mixed hyperlipidemia: Secondary | ICD-10-CM

## 2023-04-01 DIAGNOSIS — E038 Other specified hypothyroidism: Secondary | ICD-10-CM | POA: Diagnosis not present

## 2023-04-01 DIAGNOSIS — Z6835 Body mass index (BMI) 35.0-35.9, adult: Secondary | ICD-10-CM

## 2023-04-01 DIAGNOSIS — I10 Essential (primary) hypertension: Secondary | ICD-10-CM | POA: Diagnosis not present

## 2023-04-01 DIAGNOSIS — Z1211 Encounter for screening for malignant neoplasm of colon: Secondary | ICD-10-CM

## 2023-04-01 MED ORDER — ROSUVASTATIN CALCIUM 10 MG PO TABS: 10.0000 mg | ORAL_TABLET | Freq: Every day | ORAL | 1 refills | Status: DC

## 2023-04-01 MED ORDER — LOSARTAN POTASSIUM 100 MG PO TABS
100.0000 mg | ORAL_TABLET | Freq: Every day | ORAL | 1 refills | Status: DC
Start: 2023-04-01 — End: 2023-11-04

## 2023-04-01 MED ORDER — AMLODIPINE BESYLATE 5 MG PO TABS: 5.0000 mg | ORAL_TABLET | Freq: Every day | ORAL | 1 refills | Status: DC

## 2023-04-01 MED ORDER — SILDENAFIL CITRATE 50 MG PO TABS: 50.0000 mg | ORAL_TABLET | Freq: Every day | ORAL | 5 refills | Status: DC | PRN

## 2023-04-01 MED ORDER — HYDROCHLOROTHIAZIDE 25 MG PO TABS: 25.0000 mg | ORAL_TABLET | Freq: Every day | ORAL | 1 refills | Status: DC

## 2023-04-01 MED ORDER — METOPROLOL TARTRATE 25 MG PO TABS: 25.0000 mg | ORAL_TABLET | Freq: Every day | ORAL | 1 refills | Status: DC

## 2023-04-01 NOTE — Assessment & Plan Note (Signed)
Labs ordered today. Will make recommendations based on lab results. Controlled without medications. 

## 2023-04-01 NOTE — Assessment & Plan Note (Signed)
Recommended eating smaller high protein, low fat meals more frequently and exercising 30 mins a day 5 times a week with a goal of 10-15lb weight loss in the next 3 months.  

## 2023-04-01 NOTE — Assessment & Plan Note (Signed)
Labs ordered at visit today.  Will make recommendations based on lab results.   

## 2023-04-01 NOTE — Progress Notes (Signed)
BP 127/77   Pulse (!) 56   Temp 98.6 F (37 C) (Oral)   Wt 260 lb 9.6 oz (118.2 kg)   SpO2 98%   BMI 35.74 kg/m    Subjective:    Patient ID: Isaiah Terrell, male    DOB: [birth year], 52 y.o.   MRN: [id number]  HPI: Isaiah Terrell is a 52 y.o. male  Chief Complaint  Patient presents with   Hypertension   HYPERTENSION / HYPERLIPIDEMIA Satisfied with current treatment? no Duration of hypertension: years BP monitoring frequency: rarely BP range: 120/70 BP medication side effects: no Past BP meds: amlodipine, HCTZ and losartan (cozaar) Duration of hyperlipidemia: years Cholesterol medication side effects: no Cholesterol supplements: none Past cholesterol medications: rosuvastatin  Medication compliance:  none Aspirin: no Recent stressors: no Recurrent headaches: no Visual changes: no Palpitations: no Dyspnea: no Chest pain: no Lower extremity edema: no Dizzy/lightheaded: no  The 10-year ASCVD risk score (Arnett DK, et al., 2019) is: 5.4%   Values used to calculate the score:     Age: 78 years     Sex: Male     Is Non-Hispanic African American: No     Diabetic: No     Tobacco smoker: No     Systolic Blood Pressure: 127 mmHg     Is BP treated: Yes     HDL Cholesterol: 35 mg/dL     Total Cholesterol: 153 mg/dL  Relevant past medical, surgical, family and social history reviewed and updated as indicated. Interim medical history since our last visit reviewed. Allergies and medications reviewed and updated.  Review of Systems  Eyes:  Negative for visual disturbance.  Respiratory:  Negative for chest tightness and shortness of breath.   Cardiovascular:  Negative for chest pain, palpitations and leg swelling.  Neurological:  Negative for dizziness, light-headedness and headaches.    Per HPI unless specifically indicated above     Objective:    BP 127/77   Pulse (!) 56   Temp 98.6 F (37 C) (Oral)   Wt 260 lb 9.6 oz (118.2 kg)   SpO2 98%   BMI 35.74 kg/m   Wt  Readings from Last 3 Encounters:  04/01/23 260 lb 9.6 oz (118.2 kg)  10/28/22 262 lb 12.8 oz (119.2 kg)  08/28/22 260 lb 1.6 oz (118 kg)    Physical Exam Vitals and nursing note reviewed.  Constitutional:      General: He is not in acute distress.    Appearance: Normal appearance. He is not ill-appearing, toxic-appearing or diaphoretic.  HENT:     Head: Normocephalic.     Right Ear: External ear normal.     Left Ear: External ear normal.     Nose: Nose normal. No congestion or rhinorrhea.     Mouth/Throat:     Mouth: Mucous membranes are moist.  Eyes:     General:        Right eye: No discharge.        Left eye: No discharge.     Extraocular Movements: Extraocular movements intact.     Conjunctiva/sclera: Conjunctivae normal.     Pupils: Pupils are equal, round, and reactive to light.  Cardiovascular:     Rate and Rhythm: Normal rate and regular rhythm.     Heart sounds: No murmur heard. Pulmonary:     Effort: Pulmonary effort is normal. No respiratory distress.     Breath sounds: Normal breath sounds. No wheezing, rhonchi or rales.  Abdominal:  General: Abdomen is flat. Bowel sounds are normal.  Musculoskeletal:     Cervical back: Normal range of motion and neck supple.  Skin:    General: Skin is warm and dry.     Capillary Refill: Capillary refill takes less than 2 seconds.  Neurological:     General: No focal deficit present.     Mental Status: He is alert and oriented to person, place, and time.  Psychiatric:        Mood and Affect: Mood normal.        Behavior: Behavior normal.        Thought Content: Thought content normal.        Judgment: Judgment normal.     Results for orders placed or performed in visit on 10/28/22  TSH  Result Value Ref Range   TSH 3.020 0.450 - 4.500 uIU/mL  PSA  Result Value Ref Range   Prostate Specific Ag, Serum 1.7 0.0 - 4.0 ng/mL  Lipid panel  Result Value Ref Range   Cholesterol, Total 153 100 - 199 mg/dL   Triglycerides  161 (H) 0 - 149 mg/dL   HDL 35 (L) >09 mg/dL   VLDL Cholesterol Cal 40 5 - 40 mg/dL   LDL Chol Calc (NIH) 78 0 - 99 mg/dL   Chol/HDL Ratio 4.4 0.0 - 5.0 ratio  CBC with Differential/Platelet  Result Value Ref Range   WBC 7.5 3.4 - 10.8 x10E3/uL   RBC 5.98 (H) 4.14 - 5.80 x10E6/uL   Hemoglobin 17.3 13.0 - 17.7 g/dL   Hematocrit 60.4 (H) 54.0 - 51.0 %   MCV 87 79 - 97 fL   MCH 28.9 26.6 - 33.0 pg   MCHC 33.3 31.5 - 35.7 g/dL   RDW 98.1 19.1 - 47.8 %   Platelets 245 150 - 450 x10E3/uL   Neutrophils 66 Not Estab. %   Lymphs 25 Not Estab. %   Monocytes 7 Not Estab. %   Eos 1 Not Estab. %   Basos 1 Not Estab. %   Neutrophils Absolute 5.0 1.4 - 7.0 x10E3/uL   Lymphocytes Absolute 1.8 0.7 - 3.1 x10E3/uL   Monocytes Absolute 0.5 0.1 - 0.9 x10E3/uL   EOS (ABSOLUTE) 0.1 0.0 - 0.4 x10E3/uL   Basophils Absolute 0.1 0.0 - 0.2 x10E3/uL   Immature Granulocytes 0 Not Estab. %   Immature Grans (Abs) 0.0 0.0 - 0.1 x10E3/uL  Comprehensive metabolic panel  Result Value Ref Range   Glucose 106 (H) 70 - 99 mg/dL   BUN 17 6 - 24 mg/dL   Creatinine, Ser 2.95 0.76 - 1.27 mg/dL   eGFR 90 >62 ZH/YQM/5.78   BUN/Creatinine Ratio 17 9 - 20   Sodium 139 134 - 144 mmol/L   Potassium 4.5 3.5 - 5.2 mmol/L   Chloride 101 96 - 106 mmol/L   CO2 18 (L) 20 - 29 mmol/L   Calcium 10.7 (H) 8.7 - 10.2 mg/dL   Total Protein 7.5 6.0 - 8.5 g/dL   Albumin 5.2 (H) 3.8 - 4.9 g/dL   Globulin, Total 2.3 1.5 - 4.5 g/dL   Albumin/Globulin Ratio 2.3 (H) 1.2 - 2.2   Bilirubin Total 0.5 0.0 - 1.2 mg/dL   Alkaline Phosphatase 83 44 - 121 IU/L   AST 29 0 - 40 IU/L   ALT 51 (H) 0 - 44 IU/L  Urinalysis, Routine w reflex microscopic  Result Value Ref Range   Specific Gravity, UA <1.005 (L) 1.005 - 1.030   pH, UA 5.5  5.0 - 7.5   Color, UA Yellow Yellow   Appearance Ur Clear Clear   Leukocytes,UA Negative Negative   Protein,UA Negative Negative/Trace   Glucose, UA Negative Negative   Ketones, UA Negative Negative   RBC,  UA Negative Negative   Bilirubin, UA Negative Negative   Urobilinogen, Ur 0.2 0.2 - 1.0 mg/dL   Nitrite, UA Negative Negative   Microscopic Examination Comment   HgB A1c  Result Value Ref Range   Hgb A1c MFr Bld 5.9 (H) 4.8 - 5.6 %   Est. average glucose Bld gHb Est-mCnc 123 mg/dL  T4, free  Result Value Ref Range   Free T4 1.28 0.82 - 1.77 ng/dL      Assessment & Plan:   Problem List Items Addressed This Visit       Cardiovascular and Mediastinum   Essential hypertension    Chronic.  Controlled.  Continue with current medication regimen with Metoprolol 25mg , Losartan 100mg , and Amlodipine 5mg .  Does have a dry cough but wants to continue with current medication regimen at this time.  Refills sent today.  Labs ordered today.  Return to clinic in 6 months for reevaluation.  Call sooner if concerns arise.        Relevant Medications   amLODipine (NORVASC) 5 MG tablet   hydrochlorothiazide (HYDRODIURIL) 25 MG tablet   losartan (COZAAR) 100 MG tablet   metoprolol tartrate (LOPRESSOR) 25 MG tablet   rosuvastatin (CRESTOR) 10 MG tablet   sildenafil (VIAGRA) 50 MG tablet   Other Relevant Orders   Comp Met (CMET)     Endocrine   Subclinical hypothyroidism    Labs ordered today. Will make recommendations based on lab results. Controlled without medications.        Relevant Medications   metoprolol tartrate (LOPRESSOR) 25 MG tablet   Other Relevant Orders   TSH   T4, free     Other   Obesity - Primary    Recommended eating smaller high protein, low fat meals more frequently and exercising 30 mins a day 5 times a week with a goal of 10-15lb weight loss in the next 3 months.        Hyperlipidemia    Chronic.  Controlled.  Continue with current medication regimen of Crestor 10mg  daily.  Refills sent today.  Labs ordered today.  Return to clinic in 6 months for reevaluation.  Call sooner if concerns arise.        Relevant Medications   amLODipine (NORVASC) 5 MG tablet    hydrochlorothiazide (HYDRODIURIL) 25 MG tablet   losartan (COZAAR) 100 MG tablet   metoprolol tartrate (LOPRESSOR) 25 MG tablet   rosuvastatin (CRESTOR) 10 MG tablet   sildenafil (VIAGRA) 50 MG tablet   Other Relevant Orders   Lipid Profile   Prediabetes    Labs ordered at visit today.  Will make recommendations based on lab results.        Relevant Orders   HgB A1c   Other Visit Diagnoses     Screening for colon cancer       Relevant Orders   Ambulatory referral to Gastroenterology   Need for shingles vaccine       Relevant Orders   Zoster Recombinant (Shingrix )        Follow up plan: Return in about 6 months (around 10/01/2023) for Physical and Fasting labs.

## 2023-04-01 NOTE — Assessment & Plan Note (Signed)
Chronic.  Controlled.  Continue with current medication regimen of Crestor 10mg daily.  Refills sent today.  Labs ordered today.  Return to clinic in 6 months for reevaluation.  Call sooner if concerns arise.  ? ?

## 2023-04-01 NOTE — Patient Instructions (Signed)
Magnesium Oxide 400 mg

## 2023-04-01 NOTE — Assessment & Plan Note (Signed)
Chronic.  Controlled.  Continue with current medication regimen with Metoprolol 25mg , Losartan 100mg , and Amlodipine 5mg .  Does have a dry cough but wants to continue with current medication regimen at this time.  Refills sent today.  Labs ordered today.  Return to clinic in 6 months for reevaluation.  Call sooner if concerns arise.

## 2023-04-02 LAB — COMPREHENSIVE METABOLIC PANEL
ALT: 43 IU/L (ref 0–44)
AST: 28 IU/L (ref 0–40)
Albumin: 4.6 g/dL (ref 3.8–4.9)
Alkaline Phosphatase: 71 IU/L (ref 44–121)
BUN/Creatinine Ratio: 19 (ref 9–20)
BUN: 19 mg/dL (ref 6–24)
Bilirubin Total: 0.5 mg/dL (ref 0.0–1.2)
CO2: 22 mmol/L (ref 20–29)
Calcium: 10.4 mg/dL — ABNORMAL HIGH (ref 8.7–10.2)
Chloride: 101 mmol/L (ref 96–106)
Creatinine, Ser: 0.98 mg/dL (ref 0.76–1.27)
Globulin, Total: 2.1 g/dL (ref 1.5–4.5)
Glucose: 106 mg/dL — ABNORMAL HIGH (ref 70–99)
Potassium: 4.3 mmol/L (ref 3.5–5.2)
Sodium: 137 mmol/L (ref 134–144)
Total Protein: 6.7 g/dL (ref 6.0–8.5)
eGFR: 92 mL/min/{1.73_m2} (ref >59)

## 2023-04-02 LAB — LIPID PANEL
Chol/HDL Ratio: 4.5 ratio (ref 0.0–5.0)
Cholesterol, Total: 130 mg/dL (ref 100–199)
HDL: 29 mg/dL — ABNORMAL LOW (ref >39)
LDL Chol Calc (NIH): 63 mg/dL (ref 0–99)
Triglycerides: 231 mg/dL — ABNORMAL HIGH (ref 0–149)
VLDL Cholesterol Cal: 38 mg/dL (ref 5–40)

## 2023-04-02 LAB — TSH: TSH: 3.35 u[IU]/mL (ref 0.450–4.500)

## 2023-04-02 LAB — T4, FREE: Free T4: 1.25 ng/dL (ref 0.82–1.77)

## 2023-04-02 LAB — HEMOGLOBIN A1C
Est. average glucose Bld gHb Est-mCnc: 123 mg/dL
Hgb A1c MFr Bld: 5.9 % — ABNORMAL HIGH (ref 4.8–5.6)

## 2023-04-02 NOTE — Progress Notes (Signed)
HI Isaiah Terrell. It was nice to see you yesterday.  Your lab work looks good.  A1c is well controlled at 5.9%.  No concerns at this time. Continue with your current medication regimen.  Follow up as discussed.  Please let me know if you have any questions.

## 2023-04-07 ENCOUNTER — Other Ambulatory Visit: Payer: Self-pay | Admitting: *Deleted

## 2023-04-07 ENCOUNTER — Telehealth: Payer: Self-pay | Admitting: *Deleted

## 2023-04-07 DIAGNOSIS — Z1211 Encounter for screening for malignant neoplasm of colon: Secondary | ICD-10-CM

## 2023-04-07 MED ORDER — NA SULFATE-K SULFATE-MG SULF 17.5-3.13-1.6 GM/177ML PO SOLN: 1.0000 | Freq: Once | ORAL | 0 refills | Status: AC

## 2023-04-07 NOTE — Telephone Encounter (Signed)
Gastroenterology Pre-Procedure Review  Request Date: 05/08/2023 Requesting Physician: Dr. Allegra Lai  PATIENT REVIEW QUESTIONS: The patient responded to the following health history questions as indicated:    1. Are you having any GI issues? no 2. Do you have a personal history of Polyps? no 3. Do you have a family history of Colon Cancer or Polyps? no 4. Diabetes Mellitus? no 5. Joint replacements in the past 12 months?no 6. Major health problems in the past 3 months?no 7. Any artificial heart valves, MVP, or defibrillator?no    MEDICATIONS & ALLERGIES:    Patient reports the following regarding taking any anticoagulation/antiplatelet therapy:   Plavix, Coumadin, Eliquis, Xarelto, Lovenox, Pradaxa, Brilinta, or Effient? no Aspirin? no  Patient confirms/reports the following medications:  Current Outpatient Medications  Medication Sig Dispense Refill   amLODipine (NORVASC) 5 MG tablet Take 1 tablet (5 mg total) by mouth daily. 90 tablet 1   cyclobenzaprine (FLEXERIL) 10 MG tablet Take 1 tablet (10 mg total) by mouth 3 (three) times daily as needed for muscle spasms. 30 tablet 0   hydrochlorothiazide (HYDRODIURIL) 25 MG tablet Take 1 tablet (25 mg total) by mouth daily. 90 tablet 1   ibuprofen (ADVIL) 800 MG tablet Take 1 tablet (800 mg total) by mouth every 8 (eight) hours as needed. 30 tablet 0   losartan (COZAAR) 100 MG tablet Take 1 tablet (100 mg total) by mouth daily. 90 tablet 1   metoprolol tartrate (LOPRESSOR) 25 MG tablet Take 1 tablet (25 mg total) by mouth daily. 90 tablet 1   rosuvastatin (CRESTOR) 10 MG tablet Take 1 tablet (10 mg total) by mouth daily. 90 tablet 1   sildenafil (VIAGRA) 50 MG tablet Take 1 tablet (50 mg total) by mouth daily as needed for erectile dysfunction. 30 tablet 5   triamcinolone cream (KENALOG) 0.1 % Apply 1 application topically 2 (two) times daily. 60 g 0   No current facility-administered medications for this visit.    Patient confirms/reports  the following allergies:  Allergies  Allergen Reactions   Benazepril Cough   Amlodipine Other (See Comments)    Mild edema with 10 mg dose does fine with 5mg     No orders of the defined types were placed in this encounter.   AUTHORIZATION INFORMATION Primary Insurance: 1D#: Group #:  Secondary Insurance: 1D#: Group #:  SCHEDULE INFORMATION: Date: 05/08/2023 Time: Location: [facility]

## 2023-04-30 ENCOUNTER — Encounter: Payer: Self-pay | Admitting: Gastroenterology

## 2023-05-01 ENCOUNTER — Encounter: Payer: Self-pay | Admitting: Gastroenterology

## 2023-05-06 MED ORDER — BUPIVACAINE-EPINEPHRINE (PF) 0.25% -1:200000 IJ SOLN
INTRAMUSCULAR | Status: AC
  Filled 2023-05-06: qty 30

## 2023-05-06 MED ORDER — CHLORHEXIDINE GLUCONATE 0.12 % MT SOLN
OROMUCOSAL | Status: AC
  Filled 2023-05-06: qty 15

## 2023-05-06 MED ORDER — ACETAMINOPHEN 500 MG PO TABS
ORAL_TABLET | ORAL | Status: AC
  Filled 2023-05-06: qty 2

## 2023-05-08 ENCOUNTER — Other Ambulatory Visit: Payer: Self-pay

## 2023-05-08 ENCOUNTER — Encounter
Admission: RE | Disposition: A | Discharge status: Home / Self Care | Payer: Self-pay | Source: Home / Self Care | Attending: Gastroenterology

## 2023-05-08 ENCOUNTER — Ambulatory Visit: Payer: Commercial Managed Care - PPO | Admitting: Anesthesiology

## 2023-05-08 ENCOUNTER — Ambulatory Visit
Admission: RE | Admit: 2023-05-08 | Discharge: 2023-05-08 | Disposition: A | Discharge status: Home / Self Care | Payer: Commercial Managed Care - PPO | Attending: Gastroenterology | Admitting: Gastroenterology

## 2023-05-08 ENCOUNTER — Encounter: Payer: Self-pay | Admitting: Gastroenterology

## 2023-05-08 DIAGNOSIS — Z1211 Encounter for screening for malignant neoplasm of colon: Secondary | ICD-10-CM | POA: Diagnosis present

## 2023-05-08 DIAGNOSIS — Z79899 Other long term (current) drug therapy: Secondary | ICD-10-CM | POA: Diagnosis not present

## 2023-05-08 DIAGNOSIS — D122 Benign neoplasm of ascending colon: Secondary | ICD-10-CM | POA: Insufficient documentation

## 2023-05-08 DIAGNOSIS — I10 Essential (primary) hypertension: Secondary | ICD-10-CM | POA: Diagnosis not present

## 2023-05-08 DIAGNOSIS — K635 Polyp of colon: Secondary | ICD-10-CM

## 2023-05-08 HISTORY — DX: Radiculopathy, cervical region: M54.12

## 2023-05-08 HISTORY — DX: Body mass index (BMI) 39.0-39.9, adult: Z68.39

## 2023-05-08 HISTORY — PX: COLONOSCOPY WITH PROPOFOL: SHX5780

## 2023-05-08 HISTORY — PX: POLYPECTOMY: SHX5525

## 2023-05-08 SURGERY — COLONOSCOPY WITH PROPOFOL
Anesthesia: General | Site: Rectum

## 2023-05-08 MED ORDER — STERILE WATER FOR IRRIGATION IR SOLN
Status: DC | PRN
  Administered 2023-05-08: 50 mL

## 2023-05-08 MED ORDER — LIDOCAINE HCL (CARDIAC) PF 100 MG/5ML IV SOSY
PREFILLED_SYRINGE | INTRAVENOUS | Status: DC | PRN
  Administered 2023-05-08: 100 mg via INTRAVENOUS

## 2023-05-08 MED ORDER — PROPOFOL 10 MG/ML IV BOLUS
INTRAVENOUS | Status: DC | PRN
Start: 2023-05-08 — End: 2023-05-08
  Administered 2023-05-08: 100 mg via INTRAVENOUS
  Administered 2023-05-08: 50 mg via INTRAVENOUS

## 2023-05-08 MED ORDER — SODIUM CHLORIDE 0.9 % IV SOLN: INTRAVENOUS | Status: DC

## 2023-05-08 MED ORDER — PROPOFOL 500 MG/50ML IV EMUL
INTRAVENOUS | Status: DC | PRN
  Administered 2023-05-08: 150 ug/kg/min via INTRAVENOUS

## 2023-05-08 MED ORDER — LACTATED RINGERS IV SOLN: INTRAVENOUS | Status: DC

## 2023-05-08 SURGICAL SUPPLY — 8 items
GOWN CVR UNV OPN BCK APRN NK (MISCELLANEOUS) ×4 IMPLANT
GOWN ISOL THUMB LOOP REG UNIV (MISCELLANEOUS) ×4
KIT PRC NS LF DISP ENDO (KITS) ×2 IMPLANT
KIT PROCEDURE OLYMPUS (KITS) ×2
MANIFOLD NEPTUNE II (INSTRUMENTS) ×2 IMPLANT
SNARE COLD EXACTO (MISCELLANEOUS) IMPLANT
TRAP ETRAP POLY (MISCELLANEOUS) IMPLANT
WATER STERILE IRR 250ML POUR (IV SOLUTION) ×2 IMPLANT

## 2023-05-08 NOTE — Anesthesia Preprocedure Evaluation (Signed)
Anesthesia Evaluation  Patient identified by MRN, date of birth, ID band Patient awake    Reviewed: Allergy & Precautions, NPO status , Patient's Chart, lab work & pertinent test results  Airway Mallampati: III  TM Distance: >3 FB Neck ROM: full    Dental  (+) Chipped, Dental Advidsory Given   Pulmonary neg pulmonary ROS   Pulmonary exam normal        Cardiovascular hypertension, On Medications negative cardio ROS Normal cardiovascular exam     Neuro/Psych negative neurological ROS  negative psych ROS   GI/Hepatic negative GI ROS, Neg liver ROS,,,  Endo/Other  Hypothyroidism    Renal/GU negative Renal ROS  negative genitourinary   Musculoskeletal   Abdominal   Peds  Hematology negative hematology ROS (+)   Anesthesia Other Findings Past Medical History: No date: BMI 39.0-39.9,adult No date: Cervical radiculopathy No date: Hypertension  Past Surgical History: No date: HERNIA REPAIR No date: HYDROCELE EXCISION / REPAIR 2008: KNEE CARTILAGE SURGERY; Left  BMI    Body Mass Index: 38.32 kg/m      Reproductive/Obstetrics negative OB ROS                             Anesthesia Physical Anesthesia Plan  ASA: 2  Anesthesia Plan: General   Post-op Pain Management: Minimal or no pain anticipated   Induction: Intravenous  PONV Risk Score and Plan: 3 and Propofol infusion, TIVA and Ondansetron  Airway Management Planned: Nasal Cannula  Additional Equipment: None  Intra-op Plan:   Post-operative Plan:   Informed Consent: I have reviewed the patients History and Physical, chart, labs and discussed the procedure including the risks, benefits and alternatives for the proposed anesthesia with the patient or authorized representative who has indicated his/her understanding and acceptance.     Dental advisory given  Plan Discussed with: CRNA and Surgeon  Anesthesia Plan  Comments: (Discussed risks of anesthesia with patient, including possibility of difficulty with spontaneous ventilation under anesthesia necessitating airway intervention, PONV, and rare risks such as cardiac or respiratory or neurological events, and allergic reactions. Discussed the role of CRNA in patient's perioperative care. Patient understands.)       Anesthesia Quick Evaluation

## 2023-05-08 NOTE — H&P (Signed)
Arlyss Repress, MD [address]  [address]  [place], [place] [address]  Main: [phone]  Fax: [phone] Pager: [phone]  Primary Care Physician:  Larae Grooms, NP Primary Gastroenterologist:  Dr. Arlyss Repress  Pre-Procedure History & Physical: HPI:  Isaiah Terrell is a 52 y.o. male is here for an colonoscopy.   Past Medical History:  Diagnosis Date   BMI 39.0-39.9,adult    Cervical radiculopathy    Hypertension     Past Surgical History:  Procedure Laterality Date   HERNIA REPAIR     HYDROCELE EXCISION / REPAIR     KNEE CARTILAGE SURGERY Left 2008    Prior to Admission medications   Medication Sig Start Date End Date Taking? Authorizing Provider  amLODipine (NORVASC) 5 MG tablet Take 1 tablet (5 mg total) by mouth daily. 04/01/23  Yes Larae Grooms, NP  cyclobenzaprine (FLEXERIL) 10 MG tablet Take 1 tablet (10 mg total) by mouth 3 (three) times daily as needed for muscle spasms. 01/24/22  Yes Johnson, Megan P, DO  hydrochlorothiazide (HYDRODIURIL) 25 MG tablet Take 1 tablet (25 mg total) by mouth daily. 04/01/23  Yes Larae Grooms, NP  ibuprofen (ADVIL) 800 MG tablet Take 1 tablet (800 mg total) by mouth every 8 (eight) hours as needed. 12/20/21  Yes Larae Grooms, NP  losartan (COZAAR) 100 MG tablet Take 1 tablet (100 mg total) by mouth daily. 04/01/23  Yes Larae Grooms, NP  metoprolol tartrate (LOPRESSOR) 25 MG tablet Take 1 tablet (25 mg total) by mouth daily. 04/01/23  Yes Larae Grooms, NP  rosuvastatin (CRESTOR) 10 MG tablet Take 1 tablet (10 mg total) by mouth daily. 04/01/23  Yes Larae Grooms, NP  sildenafil (VIAGRA) 50 MG tablet Take 1 tablet (50 mg total) by mouth daily as needed for erectile dysfunction. 04/01/23  Yes Larae Grooms, NP  triamcinolone cream (KENALOG) 0.1 % Apply 1 application topically 2 (two) times daily. 06/30/19  Yes Particia Nearing, PA-C  metoprolol succinate (TOPROL-XL) 25 MG 24 hr tablet Take 1  tablet (25 mg total) by mouth daily. 12/31/17 01/30/18  Steele Sizer, MD    Allergies as of 04/07/2023 - Review Complete 04/01/2023  Allergen Reaction Noted   Benazepril Cough 03/21/2015   Amlodipine Other (See Comments) 12/31/2017    Family History  Problem Relation Age of Onset   Cancer Mother        ovarian   Hypertension Mother    Hypertension Father    Stroke Father    Heart disease Father    Cancer Father        prostate    Social History   Socioeconomic History   Marital status: Married    Spouse name: Not on file   Number of children: Not on file   Years of education: Not on file   Highest education level: Not on file  Occupational History   Not on file  Tobacco Use   Smoking status: Never   Smokeless tobacco: Never  Vaping Use   Vaping status: Never Used  Substance and Sexual Activity   Alcohol use: No   Drug use: No   Sexual activity: Yes  Other Topics Concern   Not on file  Social History Narrative   Not on file   Social Determinants of Health   Financial Resource Strain: Not on file  Food Insecurity: Not on file  Transportation Needs: Not on file  Physical Activity: Not on file  Stress: Not on file  Social Connections:  Not on file  Intimate Partner Violence: Not on file    Review of Systems: See HPI, otherwise negative ROS  Physical Exam: BP (!) 143/93   Temp 98.1 F (36.7 C) (Temporal)   Resp 16   Ht 5\' 8"  (1.727 m)   Wt 114.3 kg   SpO2 95%   BMI 38.32 kg/m  General:   Alert,  pleasant and cooperative in NAD Head:  Normocephalic and atraumatic. Neck:  Supple; no masses or thyromegaly. Lungs:  Clear throughout to auscultation.    Heart:  Regular rate and rhythm. Abdomen:  Soft, nontender and nondistended. Normal bowel sounds, without guarding, and without rebound.   Neurologic:  Alert and  oriented x4;  grossly normal neurologically.  Impression/Plan: Isaiah Terrell is here for an colonoscopy to be performed for colon cancer  screening  Risks, benefits, limitations, and alternatives regarding  colonoscopy have been reviewed with the patient.  Questions have been answered.  All parties agreeable.   Lannette Donath, MD  05/08/2023, 8:52 AM

## 2023-05-08 NOTE — Op Note (Signed)
[facility] Patient Name: Isaiah Terrell Procedure Date: 05/08/2023 9:54 AM MRN: [id number] Account #: [id number] Date of Birth: 1971-07-28 Admit Type: Outpatient Age: 52 Room: [facility] OR ROOM 01 Gender: Male Note Status: Finalized Instrument Name: [id number] Procedure:             Colonoscopy Indications:           Screening for colorectal malignant neoplasm, This is                         the patient's first colonoscopy Providers:             Toney Reil MD, MD Referring MD:          Larae Grooms (Referring MD) Medicines:             General Anesthesia Complications:         No immediate complications. Estimated blood loss: None. Procedure:             Pre-Anesthesia Assessment:                        - Prior to the procedure, a History and Physical was                         performed, and patient medications and allergies were                         reviewed. The patient is competent. The risks and                         benefits of the procedure and the sedation options and                         risks were discussed with the patient. All questions                         were answered and informed consent was obtained.                         Patient identification and proposed procedure were                         verified by the physician, the nurse, the                         anesthesiologist, the anesthetist and the technician                         in the pre-procedure area in the procedure room in the                         endoscopy suite. Mental Status Examination: alert and                         oriented. Airway Examination: normal oropharyngeal                         airway and neck mobility. Respiratory Examination:  clear to auscultation. CV Examination: normal.                         Prophylactic Antibiotics: The patient does not require                         prophylactic antibiotics. Prior  Anticoagulants: The                         patient has taken no anticoagulant or antiplatelet                         agents. ASA Grade Assessment: II - A patient with mild                         systemic disease. After reviewing the risks and                         benefits, the patient was deemed in satisfactory                         condition to undergo the procedure. The anesthesia                         plan was to use general anesthesia. Immediately prior                         to administration of medications, the patient was                         re-assessed for adequacy to receive sedatives. The                         heart rate, respiratory rate, oxygen saturations,                         blood pressure, adequacy of pulmonary ventilation, and                         response to care were monitored throughout the                         procedure. The physical status of the patient was                         re-assessed after the procedure.                        After obtaining informed consent, the colonoscope was                         passed under direct vision. Throughout the procedure,                         the patient's blood pressure, pulse, and oxygen                         saturations were monitored continuously. The  Colonoscope was introduced through the anus and                         advanced to the the cecum, identified by appendiceal                         orifice and ileocecal valve. The colonoscopy was                         performed without difficulty. The patient tolerated                         the procedure well. The quality of the bowel                         preparation was evaluated using the BBPS [facility] : [facility] Bowel                         Preparation Scale) with scores of: Right Colon = 3,                         Transverse Colon = 3 and Left Colon = 3 (entire mucosa                         seen well with no residual  staining, small fragments                         of stool or opaque liquid). The total BBPS score                         equals 9. The ileocecal valve, appendiceal orifice,                         and rectum were photographed. Findings:      The perianal and digital rectal examinations were normal. Pertinent       negatives include normal sphincter tone and no palpable rectal lesions.      Three sessile polyps were found in the ascending colon. The polyps were       3 to 5 mm in size. These polyps were removed with a cold snare.       Resection and retrieval were complete.      The retroflexed view of the distal rectum and anal verge was normal and       showed no anal or rectal abnormalities. Impression:            - Three 3 to 5 mm polyps in the ascending colon,                         removed with a cold snare. Resected and retrieved.                        - The distal rectum and anal verge are normal on                         retroflexion view. Recommendation:        - Discharge patient to home (with escort).                        -  Resume previous diet today.                        - Continue present medications.                        - Await pathology results.                        - Repeat colonoscopy in 5 years for surveillance of                         multiple polyps. Procedure Code(s):     --- Professional ---                        [phone], Colonoscopy, flexible; with removal of                         tumor(s), polyp(s), or other lesion(s) by snare                         technique Diagnosis Code(s):     --- Professional ---                        Z12.11, Encounter for screening for malignant neoplasm                         of colon                        D12.2, Benign neoplasm of ascending colon CPT copyright 2022 American Medical Association. All rights reserved. The codes documented in this report are preliminary and upon coder review may  be revised to meet  current compliance requirements. Dr. Libby Maw Toney Reil MD, MD 05/08/2023 10:19:46 AM This report has been signed electronically. Number of Addenda: 0 Note Initiated On: 05/08/2023 9:54 AM Scope Withdrawal Time: 0 hours 10 minutes 8 seconds  Total Procedure Duration: 0 hours 12 minutes 30 seconds  Estimated Blood Loss:  Estimated blood loss: none.      [facility]

## 2023-05-08 NOTE — Transfer of Care (Signed)
Immediate Anesthesia Transfer of Care Note  Patient: Isaiah Terrell  Procedure(s) Performed: COLONOSCOPY WITH PROPOFOL (Rectum) POLYPECTOMY (Rectum)  Patient Location: PACU  Anesthesia Type:General  Level of Consciousness: awake and alert   Airway & Oxygen Therapy: Patient Spontanous Breathing  Post-op Assessment: Report given to RN and Post -op Vital signs reviewed and stable  Post vital signs: Reviewed and stable  Last Vitals: Normal temp  see flow sheet  Vitals Value Taken Time  BP 111/75 05/08/23 1022  Temp    Pulse 69 05/08/23 1023  Resp 20 05/08/23 1023  SpO2 96 % 05/08/23 1023  Vitals shown include unfiled device data.  Last Pain:  Vitals:   05/08/23 0833  TempSrc: Temporal  PainSc: 0-No pain         Complications: No notable events documented.

## 2023-05-08 NOTE — Anesthesia Postprocedure Evaluation (Signed)
Anesthesia Post Note  Patient: Isaiah Terrell  Procedure(s) Performed: COLONOSCOPY WITH PROPOFOL (Rectum) POLYPECTOMY (Rectum)  Patient location during evaluation: PACU Anesthesia Type: General Level of consciousness: awake and alert Pain management: pain level controlled Vital Signs Assessment: post-procedure vital signs reviewed and stable Respiratory status: spontaneous breathing, nonlabored ventilation, respiratory function stable and patient connected to nasal cannula oxygen Cardiovascular status: blood pressure returned to baseline and stable Postop Assessment: no apparent nausea or vomiting Anesthetic complications: no  No notable events documented.   Last Vitals:  Vitals:   05/08/23 0833 05/08/23 1023  BP: (!) 143/93 111/75  Pulse:  71  Resp: 16 19  Temp: 36.7 C (!) 36.4 C  SpO2: 95% 97%    Last Pain:  Vitals:   05/08/23 1023  TempSrc:   PainSc: 0-No pain                 Stephanie Coup

## 2023-05-09 ENCOUNTER — Encounter: Payer: Self-pay | Admitting: Gastroenterology

## 2023-05-12 ENCOUNTER — Encounter: Payer: Self-pay | Admitting: Gastroenterology

## 2023-08-11 ENCOUNTER — Ambulatory Visit
Admission: RE | Admit: 2023-08-11 | Discharge: 2023-08-11 | Disposition: A | Discharge status: Home / Self Care | Payer: Commercial Managed Care - PPO | Source: Ambulatory Visit | Attending: Emergency Medicine | Admitting: Emergency Medicine

## 2023-08-11 VITALS — BP 135/88 | HR 75 | Temp 99.1°F | Resp 16 | Ht 69.0 in | Wt 260.0 lb

## 2023-08-11 DIAGNOSIS — J0100 Acute maxillary sinusitis, unspecified: Secondary | ICD-10-CM

## 2023-08-11 MED ORDER — AMOXICILLIN-POT CLAVULANATE 875-125 MG PO TABS: 1.0000 | ORAL_TABLET | Freq: Two times a day (BID) | ORAL | 0 refills | Status: DC

## 2023-08-11 MED ORDER — PROMETHAZINE-DM 6.25-15 MG/5ML PO SYRP: 5.0000 mL | ORAL_SOLUTION | Freq: Four times a day (QID) | ORAL | 0 refills | Status: DC | PRN

## 2023-08-11 MED ORDER — AMOXICILLIN-POT CLAVULANATE 875-125 MG PO TABS: 1.0000 | ORAL_TABLET | Freq: Two times a day (BID) | ORAL | 0 refills | Status: AC

## 2023-08-11 MED ORDER — BENZONATATE 100 MG PO CAPS: 200.0000 mg | ORAL_CAPSULE | Freq: Three times a day (TID) | ORAL | 0 refills | Status: DC

## 2023-08-11 NOTE — Discharge Instructions (Signed)
The Augmentin twice daily with food for 10 days for treatment of your sinusitis.  Perform sinus irrigation 2-3 times a day with a NeilMed sinus rinse kit and distilled water.  Do not use tap water.  You can use plain over-the-counter Mucinex every 6 hours to break up the stickiness of the mucus so your body can clear it.  Increase your oral fluid intake to thin out your mucus so that is also able for your body to clear more easily.  Take an over-the-counter probiotic, such as Culturelle-align-activia, 1 hour after each dose of antibiotic to prevent diarrhea.  Take the [facility] every 8 hours during the day as needed for cough.  Take them with a small sip of water.  They may give you numbness to the base of your tongue or metallic taste in her mouth, this is normal.  Use the Promethazine DM cough syrup at bedtime as needed for cough and congestion.  Be mindful this medication will make you drowsy so make sure you have your faculties about you if you have to get up in the middle the night to use bathroom.  If you develop any new or worsening symptoms return for reevaluation or see your primary care provider.

## 2023-08-11 NOTE — ED Provider Notes (Signed)
[facility]    CSN: [id number] Arrival date & time: 08/11/23  1333      History   Chief Complaint Chief Complaint  Patient presents with   Cough    Appt   Sinus Problem    HPI Isaiah Terrell is a 52 y.o. male.   HPI  52 year old male with a past medical history significant for hypertension, hyperlipidemia, and prediabetes presents for evaluation of 1 week worth of sinus pain and pressure, pain in his upper teeth, green nasal discharge, and a nonproductive cough.  The cough is worse at night and he reports that it does keep him up.  He has had chills off-and-on but denies any fever or ear pain.  Past Medical History:  Diagnosis Date   BMI 39.0-39.9,adult    Cervical radiculopathy    Hypertension     Patient Active Problem List   Diagnosis Date Noted   Colon cancer screening 05/08/2023   Polyp of ascending colon 05/08/2023   Subclinical hypothyroidism 01/05/2021   Hyperlipidemia 01/05/2021   Prediabetes 01/05/2021   Obesity 07/05/2019   Essential hypertension 03/29/2015    Past Surgical History:  Procedure Laterality Date   COLONOSCOPY WITH PROPOFOL N/A 05/08/2023   Procedure: COLONOSCOPY WITH PROPOFOL;  Surgeon: Toney Reil, MD;  Location: [facility];  Service: Endoscopy;  Laterality: N/A;   HERNIA REPAIR     HYDROCELE EXCISION / REPAIR     KNEE CARTILAGE SURGERY Left 2008   POLYPECTOMY  05/08/2023   Procedure: POLYPECTOMY;  Surgeon: Toney Reil, MD;  Location: [facility];  Service: Endoscopy;;       Home Medications    Prior to Admission medications   Medication Sig Start Date End Date Taking? Authorizing Provider  amLODipine (NORVASC) 5 MG tablet Take 1 tablet (5 mg total) by mouth daily. 04/01/23  Yes Larae Grooms, NP  amoxicillin-clavulanate (AUGMENTIN) 875-125 MG tablet Take 1 tablet by mouth every 12 (twelve) hours for 10 days. 08/11/23 08/21/23 Yes Becky Augusta, NP  benzonatate (TESSALON) 100 MG capsule  Take 2 capsules (200 mg total) by mouth every 8 (eight) hours. 08/11/23  Yes Becky Augusta, NP  cyclobenzaprine (FLEXERIL) 10 MG tablet Take 1 tablet (10 mg total) by mouth 3 (three) times daily as needed for muscle spasms. 01/24/22  Yes Johnson, Megan P, DO  hydrochlorothiazide (HYDRODIURIL) 25 MG tablet Take 1 tablet (25 mg total) by mouth daily. 04/01/23  Yes Larae Grooms, NP  ibuprofen (ADVIL) 800 MG tablet Take 1 tablet (800 mg total) by mouth every 8 (eight) hours as needed. 12/20/21  Yes Larae Grooms, NP  losartan (COZAAR) 100 MG tablet Take 1 tablet (100 mg total) by mouth daily. 04/01/23  Yes Larae Grooms, NP  metoprolol tartrate (LOPRESSOR) 25 MG tablet Take 1 tablet (25 mg total) by mouth daily. 04/01/23  Yes Larae Grooms, NP  promethazine-dextromethorphan (PROMETHAZINE-DM) 6.25-15 MG/5ML syrup Take 5 mLs by mouth 4 (four) times daily as needed. 08/11/23  Yes Becky Augusta, NP  rosuvastatin (CRESTOR) 10 MG tablet Take 1 tablet (10 mg total) by mouth daily. 04/01/23  Yes Larae Grooms, NP  sildenafil (VIAGRA) 50 MG tablet Take 1 tablet (50 mg total) by mouth daily as needed for erectile dysfunction. 04/01/23  Yes Larae Grooms, NP  triamcinolone cream (KENALOG) 0.1 % Apply 1 application topically 2 (two) times daily. 06/30/19  Yes Particia Nearing, PA-C  metoprolol succinate (TOPROL-XL) 25 MG 24 hr tablet Take 1 tablet (25 mg total) by mouth daily.  12/31/17 01/30/18  Steele Sizer, MD    Family History Family History  Problem Relation Age of Onset   Cancer Mother        ovarian   Hypertension Mother    Hypertension Father    Stroke Father    Heart disease Father    Cancer Father        prostate    Social History Social History   Tobacco Use   Smoking status: Never   Smokeless tobacco: Never  Vaping Use   Vaping status: Never Used  Substance Use Topics   Alcohol use: No   Drug use: No     Allergies   Benazepril and Amlodipine   Review of  Systems Review of Systems  Constitutional:  Positive for chills. Negative for fever.  HENT:  Positive for congestion, rhinorrhea and sinus pressure. Negative for ear pain.   Respiratory:  Positive for cough. Negative for shortness of breath and wheezing.      Physical Exam Triage Vital Signs ED Triage Vitals [08/11/23 1415]  Encounter Vitals Group     BP      Systolic BP Percentile      Diastolic BP Percentile      Pulse      Resp 16     Temp      Temp Source Oral     SpO2      Weight      Height      Head Circumference      Peak Flow      Pain Score      Pain Loc      Pain Education      Exclude from Growth Chart    No data found.  Updated Vital Signs BP 135/88 (BP Location: Right Arm)   Pulse 75   Temp 99.1 F (37.3 C) (Oral)   Resp 16   Ht 5\' 9"  (1.753 m)   Wt 260 lb (117.9 kg)   SpO2 95%   BMI 38.40 kg/m   Visual Acuity Right Eye Distance:   Left Eye Distance:   Bilateral Distance:    Right Eye Near:   Left Eye Near:    Bilateral Near:     Physical Exam Vitals and nursing note reviewed.  Constitutional:      Appearance: Normal appearance. He is not ill-appearing.  HENT:     Head: Normocephalic and atraumatic.     Nose: Congestion and rhinorrhea present.     Comments: Nasal mucosa is erythematous and edematous with thick green discharge in both nares.  Bilateral maxillary sinuses are tender to compression.    Mouth/Throat:     Mouth: Mucous membranes are moist.     Pharynx: Oropharynx is clear. Posterior oropharyngeal erythema present. No oropharyngeal exudate.     Comments: Erythema to the posterior oropharynx with green postnasal drip. Cardiovascular:     Rate and Rhythm: Normal rate and regular rhythm.     Pulses: Normal pulses.     Heart sounds: Normal heart sounds. No murmur heard.    No friction rub. No gallop.  Pulmonary:     Effort: Pulmonary effort is normal.     Breath sounds: Normal breath sounds. No wheezing, rhonchi or rales.   Musculoskeletal:     Cervical back: Normal range of motion and neck supple.  Lymphadenopathy:     Cervical: No cervical adenopathy.  Skin:    General: Skin is warm and dry.     Capillary Refill: Capillary  refill takes less than 2 seconds.     Findings: No rash.  Neurological:     General: No focal deficit present.     Mental Status: He is alert and oriented to person, place, and time.      UC Treatments / Results  Labs (all labs ordered are listed, but only abnormal results are displayed) Labs Reviewed - No data to display  EKG   Radiology No results found.  Procedures Procedures (including critical care time)  Medications Ordered in UC Medications - No data to display  Initial Impression / Assessment and Plan / UC Course  I have reviewed the triage vital signs and the nursing notes.  Pertinent labs & imaging results that were available during my care of the patient were reviewed by me and considered in my medical decision making (see chart for details).   Patient is a nontoxic-appearing 52 year old male presenting for evaluation of 1 week worth of sinus symptoms as outlined HPI above.  On exam he does have purulent discharge in both nares as well as maxillary sinus tenderness to compression.  No cervical lymphadenopathy present on exam and cardiopulmonary exam is benign.  Oropharyngeal exam does reveal green postnasal drip.  I will treat the patient for acute maxillary sinusitis with Augmentin 875 twice daily for 10 days.  We did discuss using sinus irrigation to help alleviate the mucus burden and aid in pain relief.  Tessalon Perles and Promethazine DM cough syrup to help with cough.  Return precautions reviewed.  Work note provided.   Final Clinical Impressions(s) / UC Diagnoses   Final diagnoses:  Acute non-recurrent maxillary sinusitis     Discharge Instructions      The Augmentin twice daily with food for 10 days for treatment of your sinusitis.  Perform  sinus irrigation 2-3 times a day with a NeilMed sinus rinse kit and distilled water.  Do not use tap water.  You can use plain over-the-counter Mucinex every 6 hours to break up the stickiness of the mucus so your body can clear it.  Increase your oral fluid intake to thin out your mucus so that is also able for your body to clear more easily.  Take an over-the-counter probiotic, such as Culturelle-align-activia, 1 hour after each dose of antibiotic to prevent diarrhea.  Take the [facility] every 8 hours during the day as needed for cough.  Take them with a small sip of water.  They may give you numbness to the base of your tongue or metallic taste in her mouth, this is normal.  Use the Promethazine DM cough syrup at bedtime as needed for cough and congestion.  Be mindful this medication will make you drowsy so make sure you have your faculties about you if you have to get up in the middle the night to use bathroom.  If you develop any new or worsening symptoms return for reevaluation or see your primary care provider.      ED Prescriptions     Medication Sig Dispense Auth. Provider   amoxicillin-clavulanate (AUGMENTIN) 875-125 MG tablet Take 1 tablet by mouth every 12 (twelve) hours for 10 days. 20 tablet Becky Augusta, NP   benzonatate (TESSALON) 100 MG capsule Take 2 capsules (200 mg total) by mouth every 8 (eight) hours. 21 capsule Becky Augusta, NP   promethazine-dextromethorphan (PROMETHAZINE-DM) 6.25-15 MG/5ML syrup Take 5 mLs by mouth 4 (four) times daily as needed. 118 mL Becky Augusta, NP      PDMP not reviewed  this encounter.   Becky Augusta, NP 08/11/23 [phone]

## 2023-08-11 NOTE — ED Triage Notes (Signed)
Pt c/o cough & sinus pressure x1 wk. Has tried sudafed,dayquil & nyquil w/o relief.

## 2023-10-12 ENCOUNTER — Other Ambulatory Visit: Payer: Self-pay

## 2023-10-12 DIAGNOSIS — M549 Dorsalgia, unspecified: Secondary | ICD-10-CM | POA: Diagnosis present

## 2023-10-12 DIAGNOSIS — M5126 Other intervertebral disc displacement, lumbar region: Secondary | ICD-10-CM | POA: Diagnosis not present

## 2023-10-12 MED ORDER — OXYCODONE-ACETAMINOPHEN 5-325 MG PO TABS
1.0000 | ORAL_TABLET | Freq: Once | ORAL | Status: AC
  Administered 2023-10-12: 1 via ORAL
  Filled 2023-10-12: qty 1

## 2023-10-12 NOTE — ED Triage Notes (Signed)
First Nurse Note:  BIB [facility] from home. Pt reports twisting/pulling a muscle in his back while in the shower yesterday. Reports pain increasing. Pain to L lower back. No relief with tylenol, muscle relaxer, or heat pad at home.   EMS VS: HR 100 130/89 94%RA RR 18

## 2023-10-12 NOTE — ED Triage Notes (Signed)
Pt to ED via EMS from home, pt reports lower back pain that began yesterday. Pt states while he was in the shower he felt a "tweak" in his lower back. Pt states pain is worse today, he has been unable to walk.

## 2023-10-13 ENCOUNTER — Emergency Department: Payer: Commercial Managed Care - PPO

## 2023-10-13 ENCOUNTER — Emergency Department
Admission: EM | Admit: 2023-10-13 | Discharge: 2023-10-13 | Disposition: A | Discharge status: Home / Self Care | Payer: Commercial Managed Care - PPO | Attending: Emergency Medicine | Admitting: Emergency Medicine

## 2023-10-13 DIAGNOSIS — M5126 Other intervertebral disc displacement, lumbar region: Secondary | ICD-10-CM

## 2023-10-13 MED ORDER — KETOROLAC TROMETHAMINE 15 MG/ML IJ SOLN
15.0000 mg | Freq: Once | INTRAMUSCULAR | Status: AC
  Administered 2023-10-13: 15 mg via INTRAVENOUS
  Filled 2023-10-13: qty 1

## 2023-10-13 MED ORDER — DEXAMETHASONE SODIUM PHOSPHATE 10 MG/ML IJ SOLN
10.0000 mg | Freq: Once | INTRAMUSCULAR | Status: AC
  Administered 2023-10-13: 10 mg via INTRAVENOUS
  Filled 2023-10-13: qty 1

## 2023-10-13 MED ORDER — IBUPROFEN 600 MG PO TABS: 600.0000 mg | ORAL_TABLET | Freq: Four times a day (QID) | ORAL | 0 refills | Status: DC | PRN

## 2023-10-13 MED ORDER — OXYCODONE-ACETAMINOPHEN 5-325 MG PO TABS
1.0000 | ORAL_TABLET | ORAL | Status: DC | PRN
  Administered 2023-10-13: 1 via ORAL
  Filled 2023-10-13: qty 1

## 2023-10-13 MED ORDER — PREDNISONE 10 MG (21) PO TBPK: ORAL_TABLET | ORAL | 0 refills | Status: DC

## 2023-10-13 MED ORDER — LIDOCAINE 5 % EX PTCH: 1.0000 | MEDICATED_PATCH | Freq: Two times a day (BID) | CUTANEOUS | 0 refills | Status: AC

## 2023-10-13 MED ORDER — HYDROMORPHONE HCL 1 MG/ML IJ SOLN
0.5000 mg | Freq: Once | INTRAMUSCULAR | Status: AC
  Administered 2023-10-13: 0.5 mg via INTRAVENOUS
  Filled 2023-10-13 (×2): qty 0.5

## 2023-10-13 MED ORDER — LIDOCAINE 5 % EX PTCH
1.0000 | MEDICATED_PATCH | CUTANEOUS | Status: DC
  Administered 2023-10-13: 1 via TRANSDERMAL
  Filled 2023-10-13: qty 1

## 2023-10-13 MED ORDER — HYDROMORPHONE HCL 1 MG/ML IJ SOLN
0.5000 mg | Freq: Once | INTRAMUSCULAR | Status: AC
  Administered 2023-10-13: 0.5 mg via INTRAVENOUS
  Filled 2023-10-13: qty 0.5

## 2023-10-13 MED ORDER — OXYCODONE HCL 5 MG PO TABS: 5.0000 mg | ORAL_TABLET | Freq: Four times a day (QID) | ORAL | 0 refills | Status: AC | PRN

## 2023-10-13 NOTE — ED Provider Notes (Signed)
[facility] Provider Note    Event Date/Time   First MD Initiated Contact with Patient 10/13/23 0740     (approximate)   History   Back Pain   HPI  Isaiah Terrell is a 52 y.o. male who is otherwise healthy who comes in with concerns for back pain that started on Saturday.  Patient reports that he was in the shower when he tweaked his lower back.  He reports that since then he is not been able to walk.  He reports significant pain down the left leg with tingling in his bottom and leg but states he has been sitting in chair for 12 + hours in waiting room.Marland Kitchen  He is unable to ambulate.  Has been taking over-the-counter Tylenol without any improvement.  Physical Exam   Triage Vital Signs: ED Triage Vitals  Encounter Vitals Group     BP 10/12/23 2203 136/85     Systolic BP Percentile --      Diastolic BP Percentile --      Pulse Rate 10/12/23 2203 90     Resp 10/12/23 2203 18     Temp 10/12/23 2203 98.5 F (36.9 C)     Temp Source 10/12/23 2203 Oral     SpO2 10/12/23 2203 95 %     Weight 10/12/23 2202 260 lb (117.9 kg)     Height 10/12/23 2202 5\' 10"  (1.778 m)     Head Circumference --      Peak Flow --      Pain Score 10/12/23 2202 10     Pain Loc --      Pain Education --      Exclude from Growth Chart --     Most recent vital signs: Vitals:   10/13/23 0158 10/13/23 0558  BP: 119/89 130/86  Pulse: 73 71  Resp: 20 18  Temp: 98.2 F (36.8 C) 98.2 F (36.8 C)  SpO2: 97% 97%     General: Awake, no distress.  CV:  Good peripheral perfusion.  Resp:  Normal effort.  Abd:  No distention.  Other:  Equal strength in bilateral legs.  Some sensation changes on the left leg   ED Results / Procedures / Treatments   Labs (all labs ordered are listed, but only abnormal results are displayed) Labs Reviewed - No data to display   EKG  My interpretation of EKG:    RADIOLOGY I have reviewed the    PROCEDURES:  Critical Care performed:  No  Procedures   MEDICATIONS ORDERED IN ED: Medications  oxyCODONE-acetaminophen (PERCOCET/ROXICET) 5-325 MG per tablet 1 tablet (1 tablet Oral Given 10/13/23 0539)  HYDROmorphone (DILAUDID) injection 0.5 mg (has no administration in time range)  lidocaine (LIDODERM) 5 % 1 patch (1 patch Transdermal Patch Applied 10/13/23 0844)  oxyCODONE-acetaminophen (PERCOCET/ROXICET) 5-325 MG per tablet 1 tablet (1 tablet Oral Given 10/12/23 2205)     IMPRESSION / MDM / ASSESSMENT AND PLAN / ED COURSE  I reviewed the triage vital signs and the nursing notes.   Patient's presentation is most consistent with acute presentation with potential threat to life or bodily function.   Patient comes in with concerns for severe back pain.  Unable to ambulate with me at bedside.  Does have some tingling in the leg and the bottom so MRI ordered to evaluate for cord compression  1. Left foraminal and extraforaminal disc protrusion at L3-4  potentially affecting the left L3 nerve.  2. Mild disc and facet degeneration  elsewhere without stenosis.    Patient given multiple doses of IV pain medicine, oxycodone, lidocaine patch.  MRIs without evidence of any cord compression or cauda equina.  Patient does have herniated disc with some impingement on L3 which is consistent with patient's symptoms.  We discussed admission versus going home but patient is willing to try conservative management at home and will follow-up outpatient with either orthopedics or neurosurgery and return to the ER if he develops worsening symptoms or any other concerns    FINAL CLINICAL IMPRESSION(S) / ED DIAGNOSES   Final diagnoses:  Lumbar herniated disc     Rx / DC Orders   ED Discharge Orders     None        Note:  This document was prepared using Dragon voice recognition software and may include unintentional dictation errors.   Concha Se, MD 10/13/23 [phone]

## 2023-10-13 NOTE — Discharge Instructions (Addendum)
Tylenol 1 g every 8 hours with the prescribed medications use the oxycodone only as needed as this can be addictive and follow-up with either neurosurgery or orthopedic surgery for your herniated disc.  Return to the ER for worsening symptoms or any other concerns  1. Left foraminal and extraforaminal disc protrusion at L3-4  potentially affecting the left L3 nerve.  2. Mild disc and facet degeneration elsewhere without stenosis.  Take oxycodone as prescribed. Do not drink alcohol, drive or participate in any other potentially dangerous activities while taking this medication as it may make you sleepy. Do not take this medication with any other sedating medications, either prescription or over-the-counter. If you were prescribed Percocet or Vicodin, do not take these with acetaminophen (Tylenol) as it is already contained within these medications.  This medication is an opiate (or narcotic) pain medication and can be habit forming. Use it as little as possible to achieve adequate pain control. Do not use or use it with extreme caution if you have a history of opiate abuse or dependence. If you are on a pain contract with your primary care doctor or a pain specialist, be sure to let them know you were prescribed this medication today from the Emergency Department. This medication is intended for your use only - do not give any to anyone else and keep it in a secure place where nobody else, especially children, have access to it.

## 2023-10-18 ENCOUNTER — Encounter: Payer: Self-pay | Admitting: Nurse Practitioner

## 2023-10-20 NOTE — Telephone Encounter (Signed)
 Called and scheduled patient on 10/21/2023 @ 9:20 am.

## 2023-10-21 ENCOUNTER — Ambulatory Visit: Payer: Commercial Managed Care - PPO | Admitting: Nurse Practitioner

## 2023-10-21 ENCOUNTER — Encounter: Payer: Self-pay | Admitting: Nurse Practitioner

## 2023-10-21 VITALS — BP 137/81 | HR 73 | Temp 97.5°F | Ht 71.5 in | Wt 267.2 lb

## 2023-10-21 DIAGNOSIS — M5126 Other intervertebral disc displacement, lumbar region: Secondary | ICD-10-CM | POA: Diagnosis not present

## 2023-10-21 DIAGNOSIS — Z23 Encounter for immunization: Secondary | ICD-10-CM | POA: Diagnosis not present

## 2023-10-21 NOTE — Progress Notes (Signed)
 BP 137/81 (BP Location: Right Arm, Patient Position: Sitting, Cuff Size: Large)   Pulse 73   Temp (!) 97.5 F (36.4 C) (Oral)   Ht 5' 11.5 (1.816 m)   Wt 267 lb 3.2 oz (121.2 kg)   SpO2 96%   BMI 36.75 kg/m    Subjective:    Patient ID: Isaiah Terrell, male    DOB: 28-Jan-1971, 53 y.o.   MRN: [id number]  HPI: Isaiah Terrell is a 53 y.o. male  Chief Complaint  Patient presents with   Back Pain   BACK PAIN Patient was seen in the ER on 12/30 for back pain and ws found to have a herniated disc.  He was given prednisone , ibuprofen  and oxycodone  but only had to take the Oxycodone  twice.  Patient does not have an appt with a spine specialist.  He has had back pain on and off for many years but it recently worsened to the point where he wasn't able to walk which prompted him to go to the ER.  Patient's pain has improved and rarely needs medication at this point.   Relevant past medical, surgical, family and social history reviewed and updated as indicated. Interim medical history since our last visit reviewed. Allergies and medications reviewed and updated.  Review of Systems  Musculoskeletal:  Positive for back pain.    Per HPI unless specifically indicated above     Objective:    BP 137/81 (BP Location: Right Arm, Patient Position: Sitting, Cuff Size: Large)   Pulse 73   Temp (!) 97.5 F (36.4 C) (Oral)   Ht 5' 11.5 (1.816 m)   Wt 267 lb 3.2 oz (121.2 kg)   SpO2 96%   BMI 36.75 kg/m   Wt Readings from Last 3 Encounters:  10/21/23 267 lb 3.2 oz (121.2 kg)  10/12/23 260 lb (117.9 kg)  08/11/23 260 lb (117.9 kg)    Physical Exam Vitals and nursing note reviewed.  Constitutional:      General: He is not in acute distress.    Appearance: Normal appearance. He is not ill-appearing, toxic-appearing or diaphoretic.  HENT:     Head: Normocephalic.     Right Ear: External ear normal.     Left Ear: External ear normal.     Nose: Nose normal. No congestion or rhinorrhea.      Mouth/Throat:     Mouth: Mucous membranes are moist.  Eyes:     General:        Right eye: No discharge.        Left eye: No discharge.     Extraocular Movements: Extraocular movements intact.     Conjunctiva/sclera: Conjunctivae normal.     Pupils: Pupils are equal, round, and reactive to light.  Cardiovascular:     Rate and Rhythm: Normal rate and regular rhythm.     Heart sounds: No murmur heard. Pulmonary:     Effort: Pulmonary effort is normal. No respiratory distress.     Breath sounds: Normal breath sounds. No wheezing, rhonchi or rales.  Abdominal:     General: Abdomen is flat. Bowel sounds are normal.  Musculoskeletal:     Cervical back: Normal range of motion and neck supple.  Skin:    General: Skin is warm and dry.     Capillary Refill: Capillary refill takes less than 2 seconds.  Neurological:     General: No focal deficit present.     Mental Status: He is alert and oriented to person, place, and  time.  Psychiatric:        Mood and Affect: Mood normal.        Behavior: Behavior normal.        Thought Content: Thought content normal.        Judgment: Judgment normal.     Results for orders placed or performed in visit on 04/01/23  Comp Met (CMET)   Collection Time: 04/01/23  9:12 AM  Result Value Ref Range   Glucose 106 (H) 70 - 99 mg/dL   BUN 19 6 - 24 mg/dL   Creatinine, Ser 9.01 0.76 - 1.27 mg/dL   eGFR 92 >40 fO/fpw/8.26   BUN/Creatinine Ratio 19 9 - 20   Sodium 137 134 - 144 mmol/L   Potassium 4.3 3.5 - 5.2 mmol/L   Chloride 101 96 - 106 mmol/L   CO2 22 20 - 29 mmol/L   Calcium  10.4 (H) 8.7 - 10.2 mg/dL   Total Protein 6.7 6.0 - 8.5 g/dL   Albumin 4.6 3.8 - 4.9 g/dL   Globulin, Total 2.1 1.5 - 4.5 g/dL   Bilirubin Total 0.5 0.0 - 1.2 mg/dL   Alkaline Phosphatase 71 44 - 121 IU/L   AST 28 0 - 40 IU/L   ALT 43 0 - 44 IU/L  Lipid Profile   Collection Time: 04/01/23  9:12 AM  Result Value Ref Range   Cholesterol, Total 130 100 - 199 mg/dL    Triglycerides 768 (H) 0 - 149 mg/dL   HDL 29 (L) >60 mg/dL   VLDL Cholesterol Cal 38 5 - 40 mg/dL   LDL Chol Calc (NIH) 63 0 - 99 mg/dL   Chol/HDL Ratio 4.5 0.0 - 5.0 ratio  HgB A1c   Collection Time: 04/01/23  9:12 AM  Result Value Ref Range   Hgb A1c MFr Bld 5.9 (H) 4.8 - 5.6 %   Est. average glucose Bld gHb Est-mCnc 123 mg/dL  TSH   Collection Time: 04/01/23  9:12 AM  Result Value Ref Range   TSH 3.350 0.450 - 4.500 uIU/mL  T4, free   Collection Time: 04/01/23  9:12 AM  Result Value Ref Range   Free T4 1.25 0.82 - 1.77 ng/dL      Assessment & Plan:   Problem List Items Addressed This Visit   None Visit Diagnoses       Lumbar herniated disc    -  Primary   Discussed etiology of herniated disc and long term management. Recommend he establish with a spine specialist due to likliehood of flares of pain in the future.   Relevant Orders   Ambulatory referral to Neurosurgery     Need for influenza vaccination       Relevant Orders   Flu vaccine trivalent PF, 6mos and older(Flulaval,Afluria,Fluarix,Fluzone)        Follow up plan: No follow-ups on file.

## 2023-11-04 ENCOUNTER — Encounter: Payer: Self-pay | Admitting: Nurse Practitioner

## 2023-11-04 ENCOUNTER — Ambulatory Visit (INDEPENDENT_AMBULATORY_CARE_PROVIDER_SITE_OTHER): Payer: Commercial Managed Care - PPO | Admitting: Nurse Practitioner

## 2023-11-04 VITALS — BP 128/85 | HR 69 | Temp 97.9°F | Ht 71.5 in | Wt 270.0 lb

## 2023-11-04 DIAGNOSIS — E66812 Obesity, class 2: Secondary | ICD-10-CM

## 2023-11-04 DIAGNOSIS — Z6835 Body mass index (BMI) 35.0-35.9, adult: Secondary | ICD-10-CM

## 2023-11-04 DIAGNOSIS — E782 Mixed hyperlipidemia: Secondary | ICD-10-CM | POA: Diagnosis not present

## 2023-11-04 DIAGNOSIS — E038 Other specified hypothyroidism: Secondary | ICD-10-CM | POA: Diagnosis not present

## 2023-11-04 DIAGNOSIS — I10 Essential (primary) hypertension: Secondary | ICD-10-CM

## 2023-11-04 DIAGNOSIS — Z0000 Encounter for general adult medical examination without abnormal findings: Secondary | ICD-10-CM

## 2023-11-04 DIAGNOSIS — R7303 Prediabetes: Secondary | ICD-10-CM

## 2023-11-04 LAB — URINALYSIS, ROUTINE W REFLEX MICROSCOPIC
Bilirubin, UA: NEGATIVE
Glucose, UA: NEGATIVE
Ketones, UA: NEGATIVE
Leukocytes,UA: NEGATIVE
Nitrite, UA: NEGATIVE
Protein,UA: NEGATIVE
RBC, UA: NEGATIVE
Specific Gravity, UA: 1.01 (ref 1.005–1.030)
Urobilinogen, Ur: 0.2 mg/dL (ref 0.2–1.0)
pH, UA: 6.5 (ref 5.0–7.5)

## 2023-11-04 MED ORDER — HYDROCHLOROTHIAZIDE 25 MG PO TABS: 25.0000 mg | ORAL_TABLET | Freq: Every day | ORAL | 1 refills | Status: DC

## 2023-11-04 MED ORDER — LOSARTAN POTASSIUM 100 MG PO TABS: 100.0000 mg | ORAL_TABLET | Freq: Every day | ORAL | 1 refills | Status: DC

## 2023-11-04 MED ORDER — AMLODIPINE BESYLATE 5 MG PO TABS
5.0000 mg | ORAL_TABLET | Freq: Every day | ORAL | 1 refills | Status: DC
Start: 2023-11-04 — End: 2024-05-06

## 2023-11-04 MED ORDER — METOPROLOL TARTRATE 25 MG PO TABS: 25.0000 mg | ORAL_TABLET | Freq: Every day | ORAL | 1 refills | Status: DC

## 2023-11-04 MED ORDER — ROSUVASTATIN CALCIUM 10 MG PO TABS: 10.0000 mg | ORAL_TABLET | Freq: Every day | ORAL | 1 refills | Status: DC

## 2023-11-04 NOTE — Assessment & Plan Note (Signed)
Chronic.  Controlled.  Continue with current medication regimen with Metoprolol 25mg, Losartan 100mg, and Amlodipine 5mg.  Refills sent today.  Labs ordered today.  Return to clinic in 6 months for reevaluation.  Call sooner if concerns arise.   

## 2023-11-04 NOTE — Assessment & Plan Note (Signed)
Chronic.  Controlled.  Continue with current medication regimen of Crestor 10mg daily.  Refills sent today.  Labs ordered today.  Return to clinic in 6 months for reevaluation.  Call sooner if concerns arise.   

## 2023-11-04 NOTE — Assessment & Plan Note (Signed)
Controlled. Labs ordered today. Will make recommendations based on lab results. Controlled without medications.

## 2023-11-04 NOTE — Progress Notes (Signed)
BP 128/85 (BP Location: Left Arm, Patient Position: Sitting, Cuff Size: Large)   Pulse 69   Temp 97.9 F (36.6 C) (Oral)   Ht 5' 11.5" (1.816 m)   Wt 270 lb (122.5 kg)   SpO2 95%   BMI 37.13 kg/m    Subjective:    Patient ID: Isaiah Terrell, male    DOB: 1971-03-15, 53 y.o.   MRN: [id number]  HPI: Waldron Matott is a 53 y.o. male presenting on 11/04/2023 for comprehensive medical examination. Current medical complaints include:none  He currently lives with: Interim Problems from his last visit: no  HYPERTENSION / HYPERLIPIDEMIA Satisfied with current treatment? no Duration of hypertension: years BP monitoring frequency: not checking BP range:  BP medication side effects: no Past BP meds:  Metoprolol, amlodipine, HCTZ, and losartan (cozaar) Duration of hyperlipidemia: years Cholesterol medication side effects: no Cholesterol supplements: none Past cholesterol medications: rosuvastatin (crestor) Medication compliance: excellent compliance Aspirin: no Recent stressors: no Recurrent headaches: no Visual changes: no Palpitations: no Dyspnea: no Chest pain: no Lower extremity edema: no Dizzy/lightheaded: no   Depression Screen done today and results listed below:     11/04/2023    8:53 AM 10/21/2023    9:24 AM 04/01/2023    8:48 AM 10/28/2022    8:47 AM 08/28/2022    1:03 PM  Depression screen PHQ 2/9  Decreased Interest 0 0 0 0 0  Down, Depressed, Hopeless 0 0 0 0 0  PHQ - 2 Score 0 0 0 0 0  Altered sleeping 0 0 0 0 0  Tired, decreased energy 0 0 0 0 0  Change in appetite 0 0 0 0 0  Feeling bad or failure about yourself  0 0 0 0 0  Trouble concentrating 0 0 0 0 0  Moving slowly or fidgety/restless 0 0 0 0 0  Suicidal thoughts 0 0 0 0 0  PHQ-9 Score 0 0 0 0 0  Difficult doing work/chores    Not difficult at all Not difficult at all    The patient does not have a history of falls. I did complete a risk assessment for falls. A plan of care for falls was  documented.   Past Medical History:  Past Medical History:  Diagnosis Date   BMI 39.0-39.9,adult    Cervical radiculopathy    Hypertension     Surgical History:  Past Surgical History:  Procedure Laterality Date   COLONOSCOPY WITH PROPOFOL N/A 05/08/2023   Procedure: COLONOSCOPY WITH PROPOFOL;  Surgeon: Toney Reil, MD;  Location: [facility];  Service: Endoscopy;  Laterality: N/A;   HERNIA REPAIR     HYDROCELE EXCISION / REPAIR     KNEE CARTILAGE SURGERY Left 2008   POLYPECTOMY  05/08/2023   Procedure: POLYPECTOMY;  Surgeon: Toney Reil, MD;  Location: [facility];  Service: Endoscopy;;    Medications:  Current Outpatient Medications on File Prior to Visit  Medication Sig   sildenafil (VIAGRA) 50 MG tablet Take 1 tablet (50 mg total) by mouth daily as needed for erectile dysfunction.   triamcinolone cream (KENALOG) 0.1 % Apply 1 application topically 2 (two) times daily.   [DISCONTINUED] metoprolol succinate (TOPROL-XL) 25 MG 24 hr tablet Take 1 tablet (25 mg total) by mouth daily.   No current facility-administered medications on file prior to visit.    Allergies:  Allergies  Allergen Reactions   Benazepril Cough   Amlodipine Other (See Comments)    Mild edema with 10 mg  dose does fine with 5mg     Social History:  Social History   Socioeconomic History   Marital status: Married    Spouse name: Not on file   Number of children: Not on file   Years of education: Not on file   Highest education level: Not on file  Occupational History   Not on file  Tobacco Use   Smoking status: Never   Smokeless tobacco: Never  Vaping Use   Vaping status: Never Used  Substance and Sexual Activity   Alcohol use: No   Drug use: No   Sexual activity: Yes  Other Topics Concern   Not on file  Social History Narrative   Not on file   Social Drivers of Health   Financial Resource Strain: Not on file  Food Insecurity: Not on file   Transportation Needs: Not on file  Physical Activity: Not on file  Stress: Not on file  Social Connections: Not on file  Intimate Partner Violence: Not on file   Social History   Tobacco Use  Smoking Status Never  Smokeless Tobacco Never   Social History   Substance and Sexual Activity  Alcohol Use No    Family History:  Family History  Problem Relation Age of Onset   Cancer Mother        ovarian   Hypertension Mother    Hypertension Father    Stroke Father    Heart disease Father    Cancer Father        prostate    Past medical history, surgical history, medications, allergies, family history and social history reviewed with patient today and changes made to appropriate areas of the chart.   Review of Systems  Eyes:  Negative for blurred vision and double vision.  Respiratory:  Negative for shortness of breath.   Cardiovascular:  Negative for chest pain, palpitations and leg swelling.  Neurological:  Negative for dizziness and headaches.   All other ROS negative except what is listed above and in the HPI.      Objective:    BP 128/85 (BP Location: Left Arm, Patient Position: Sitting, Cuff Size: Large)   Pulse 69   Temp 97.9 F (36.6 C) (Oral)   Ht 5' 11.5" (1.816 m)   Wt 270 lb (122.5 kg)   SpO2 95%   BMI 37.13 kg/m   Wt Readings from Last 3 Encounters:  11/04/23 270 lb (122.5 kg)  10/21/23 267 lb 3.2 oz (121.2 kg)  10/12/23 260 lb (117.9 kg)    Physical Exam Vitals and nursing note reviewed.  Constitutional:      General: He is not in acute distress.    Appearance: Normal appearance. He is obese. He is not ill-appearing, toxic-appearing or diaphoretic.  HENT:     Head: Normocephalic.     Right Ear: Tympanic membrane, ear canal and external ear normal.     Left Ear: Tympanic membrane, ear canal and external ear normal.     Nose: Nose normal. No congestion or rhinorrhea.     Mouth/Throat:     Mouth: Mucous membranes are moist.  Eyes:      General:        Right eye: No discharge.        Left eye: No discharge.     Extraocular Movements: Extraocular movements intact.     Conjunctiva/sclera: Conjunctivae normal.     Pupils: Pupils are equal, round, and reactive to light.  Cardiovascular:     Rate and  Rhythm: Normal rate and regular rhythm.     Heart sounds: No murmur heard. Pulmonary:     Effort: Pulmonary effort is normal. No respiratory distress.     Breath sounds: Normal breath sounds. No wheezing, rhonchi or rales.  Abdominal:     General: Abdomen is flat. Bowel sounds are normal. There is no distension.     Palpations: Abdomen is soft.     Tenderness: There is no abdominal tenderness. There is no guarding.  Musculoskeletal:     Cervical back: Normal range of motion and neck supple.  Skin:    General: Skin is warm and dry.     Capillary Refill: Capillary refill takes less than 2 seconds.  Neurological:     General: No focal deficit present.     Mental Status: He is alert and oriented to person, place, and time.     Cranial Nerves: No cranial nerve deficit.     Motor: No weakness.     Deep Tendon Reflexes: Reflexes normal.  Psychiatric:        Mood and Affect: Mood normal.        Behavior: Behavior normal.        Thought Content: Thought content normal.        Judgment: Judgment normal.     Results for orders placed or performed in visit on 04/01/23  Comp Met (CMET)   Collection Time: 04/01/23  9:12 AM  Result Value Ref Range   Glucose 106 (H) 70 - 99 mg/dL   BUN 19 6 - 24 mg/dL   Creatinine, Ser 9.60 0.76 - 1.27 mg/dL   eGFR 92 >45 WU/JWJ/1.91   BUN/Creatinine Ratio 19 9 - 20   Sodium 137 134 - 144 mmol/L   Potassium 4.3 3.5 - 5.2 mmol/L   Chloride 101 96 - 106 mmol/L   CO2 22 20 - 29 mmol/L   Calcium 10.4 (H) 8.7 - 10.2 mg/dL   Total Protein 6.7 6.0 - 8.5 g/dL   Albumin 4.6 3.8 - 4.9 g/dL   Globulin, Total 2.1 1.5 - 4.5 g/dL   Bilirubin Total 0.5 0.0 - 1.2 mg/dL   Alkaline Phosphatase 71 44 - 121  IU/L   AST 28 0 - 40 IU/L   ALT 43 0 - 44 IU/L  Lipid Profile   Collection Time: 04/01/23  9:12 AM  Result Value Ref Range   Cholesterol, Total 130 100 - 199 mg/dL   Triglycerides 478 (H) 0 - 149 mg/dL   HDL 29 (L) >29 mg/dL   VLDL Cholesterol Cal 38 5 - 40 mg/dL   LDL Chol Calc (NIH) 63 0 - 99 mg/dL   Chol/HDL Ratio 4.5 0.0 - 5.0 ratio  HgB A1c   Collection Time: 04/01/23  9:12 AM  Result Value Ref Range   Hgb A1c MFr Bld 5.9 (H) 4.8 - 5.6 %   Est. average glucose Bld gHb Est-mCnc 123 mg/dL  TSH   Collection Time: 04/01/23  9:12 AM  Result Value Ref Range   TSH 3.350 0.450 - 4.500 uIU/mL  T4, free   Collection Time: 04/01/23  9:12 AM  Result Value Ref Range   Free T4 1.25 0.82 - 1.77 ng/dL      Assessment & Plan:   Problem List Items Addressed This Visit       Cardiovascular and Mediastinum   Essential hypertension   Chronic.  Controlled.  Continue with current medication regimen with Metoprolol 25mg , Losartan 100mg , and Amlodipine 5mg .  Refills  sent today.  Labs ordered today.  Return to clinic in 6 months for reevaluation.  Call sooner if concerns arise.       Relevant Medications   amLODipine (NORVASC) 5 MG tablet   hydrochlorothiazide (HYDRODIURIL) 25 MG tablet   losartan (COZAAR) 100 MG tablet   metoprolol tartrate (LOPRESSOR) 25 MG tablet   rosuvastatin (CRESTOR) 10 MG tablet     Endocrine   Subclinical hypothyroidism   Controlled. Labs ordered today. Will make recommendations based on lab results. Controlled without medications.        Relevant Medications   metoprolol tartrate (LOPRESSOR) 25 MG tablet   Other Relevant Orders   TSH   T4, free     Other   Obesity   Recommended eating smaller high protein, low fat meals more frequently and exercising 30 mins a day 5 times a week with a goal of 10-15lb weight loss in the next 3 months.       Hyperlipidemia   Chronic.  Controlled.  Continue with current medication regimen of Crestor 10mg  daily.   Refills sent today.  Labs ordered today.  Return to clinic in 6 months for reevaluation.  Call sooner if concerns arise.       Relevant Medications   amLODipine (NORVASC) 5 MG tablet   hydrochlorothiazide (HYDRODIURIL) 25 MG tablet   losartan (COZAAR) 100 MG tablet   metoprolol tartrate (LOPRESSOR) 25 MG tablet   rosuvastatin (CRESTOR) 10 MG tablet   Other Relevant Orders   Lipid panel   Prediabetes   Chronic.  Controlled.  Last A1c was 5.9%.  Recommend a low carb diet.  Labs ordered today.  Follow up in 6 months.  Call sooner if concerns arise.      Relevant Orders   Hemoglobin A1c   Other Visit Diagnoses       Annual physical exam    -  Primary   Health maintenance reviewed during visit today.  Labs ordered.  Vaccines reviewed.  Colonoscopy up to date.   Relevant Orders   TSH   PSA   Lipid panel   CBC with Differential/Platelet   Comprehensive metabolic panel   Urinalysis, Routine w reflex microscopic   Hemoglobin A1c   T4, free        Discussed aspirin prophylaxis for myocardial infarction prevention and decision was it was not indicated  LABORATORY TESTING:  Health maintenance labs ordered today as discussed above.   The natural history of prostate cancer and ongoing controversy regarding screening and potential treatment outcomes of prostate cancer has been discussed with the patient. The meaning of a false positive PSA and a false negative PSA has been discussed. He indicates understanding of the limitations of this screening test and wishes to proceed with screening PSA testing.   IMMUNIZATIONS:   - Tdap: Tetanus vaccination status reviewed: last tetanus booster within 10 years. - Influenza: up to date - Pneumovax: Not applicable - Prevnar: Not applicable - HPV: Not applicable - Zostavax vaccine:  Discussed today  SCREENING: - Colonoscopy: Order given today  Discussed with patient purpose of the colonoscopy is to detect colon cancer at curable precancerous  or early stages   - AAA Screening: Not applicable  -Hearing Test: Not applicable  -Spirometry: Not applicable   PATIENT COUNSELING:    Sexuality: Discussed sexually transmitted diseases, partner selection, use of condoms, avoidance of unintended pregnancy  and contraceptive alternatives.   Advised to avoid cigarette smoking.  I discussed with the patient that most people  either abstain from alcohol or drink within safe limits (<=14/week and <=4 drinks/occasion for males, <=7/weeks and <= 3 drinks/occasion for females) and that the risk for alcohol disorders and other health effects rises proportionally with the number of drinks per week and how often a drinker exceeds daily limits.  Discussed cessation/primary prevention of drug use and availability of treatment for abuse.   Diet: Encouraged to adjust caloric intake to maintain  or achieve ideal body weight, to reduce intake of dietary saturated fat and total fat, to limit sodium intake by avoiding high sodium foods and not adding table salt, and to maintain adequate dietary potassium and calcium preferably from fresh fruits, vegetables, and low-fat dairy products.    stressed the importance of regular exercise  Injury prevention: Discussed safety belts, safety helmets, smoke detector, smoking near bedding or upholstery.   Dental health: Discussed importance of regular tooth brushing, flossing, and dental visits.   Follow up plan: NEXT PREVENTATIVE PHYSICAL DUE IN 1 YEAR. Return in about 6 months (around 05/03/2024) for HTN, HLD, DM2 FU.

## 2023-11-04 NOTE — Assessment & Plan Note (Signed)
Recommended eating smaller high protein, low fat meals more frequently and exercising 30 mins a day 5 times a week with a goal of 10-15lb weight loss in the next 3 months.  

## 2023-11-04 NOTE — Assessment & Plan Note (Signed)
Chronic.  Controlled.  Last A1c was 5.9%.  Recommend a low carb diet.  Labs ordered today.  Follow up in 6 months.  Call sooner if concerns arise.

## 2023-11-05 ENCOUNTER — Encounter: Payer: Self-pay | Admitting: Nurse Practitioner

## 2023-11-05 LAB — CBC WITH DIFFERENTIAL/PLATELET
Basophils Absolute: 0 10*3/uL (ref 0.0–0.2)
Basos: 1 %
EOS (ABSOLUTE): 0.1 10*3/uL (ref 0.0–0.4)
Eos: 1 %
Hematocrit: 49.4 % (ref 37.5–51.0)
Hemoglobin: 16.3 g/dL (ref 13.0–17.7)
Immature Grans (Abs): 0 10*3/uL (ref 0.0–0.1)
Immature Granulocytes: 0 %
Lymphocytes Absolute: 1.6 10*3/uL (ref 0.7–3.1)
Lymphs: 31 %
MCH: 29.4 pg (ref 26.6–33.0)
MCHC: 33 g/dL (ref 31.5–35.7)
MCV: 89 fL (ref 79–97)
Monocytes Absolute: 0.5 10*3/uL (ref 0.1–0.9)
Monocytes: 9 %
Neutrophils Absolute: 2.8 10*3/uL (ref 1.4–7.0)
Neutrophils: 58 %
Platelets: 231 10*3/uL (ref 150–450)
RBC: 5.55 x10E6/uL (ref 4.14–5.80)
RDW: 13.1 % (ref 11.6–15.4)
WBC: 4.9 10*3/uL (ref 3.4–10.8)

## 2023-11-05 LAB — COMPREHENSIVE METABOLIC PANEL
ALT: 55 [IU]/L — ABNORMAL HIGH (ref 0–44)
AST: 34 [IU]/L (ref 0–40)
Albumin: 4.5 g/dL (ref 3.8–4.9)
Alkaline Phosphatase: 70 [IU]/L (ref 44–121)
BUN/Creatinine Ratio: 15 (ref 9–20)
BUN: 14 mg/dL (ref 6–24)
Bilirubin Total: 0.7 mg/dL (ref 0.0–1.2)
CO2: 23 mmol/L (ref 20–29)
Calcium: 9.9 mg/dL (ref 8.7–10.2)
Chloride: 102 mmol/L (ref 96–106)
Creatinine, Ser: 0.95 mg/dL (ref 0.76–1.27)
Globulin, Total: 1.9 g/dL (ref 1.5–4.5)
Glucose: 110 mg/dL — ABNORMAL HIGH (ref 70–99)
Potassium: 4.3 mmol/L (ref 3.5–5.2)
Sodium: 138 mmol/L (ref 134–144)
Total Protein: 6.4 g/dL (ref 6.0–8.5)
eGFR: 95 mL/min/{1.73_m2} (ref >59)

## 2023-11-05 LAB — HEMOGLOBIN A1C
Est. average glucose Bld gHb Est-mCnc: 128 mg/dL
Hgb A1c MFr Bld: 6.1 % — ABNORMAL HIGH (ref 4.8–5.6)

## 2023-11-05 LAB — T4, FREE: Free T4: 1.28 ng/dL (ref 0.82–1.77)

## 2023-11-05 LAB — LIPID PANEL
Chol/HDL Ratio: 4.8 {ratio} (ref 0.0–5.0)
Cholesterol, Total: 140 mg/dL (ref 100–199)
HDL: 29 mg/dL — ABNORMAL LOW (ref >39)
LDL Chol Calc (NIH): 73 mg/dL (ref 0–99)
Triglycerides: 228 mg/dL — ABNORMAL HIGH (ref 0–149)
VLDL Cholesterol Cal: 38 mg/dL (ref 5–40)

## 2023-11-05 LAB — PSA: Prostate Specific Ag, Serum: 1.4 ng/mL (ref 0.0–4.0)

## 2023-11-05 LAB — TSH: TSH: 2.54 u[IU]/mL (ref 0.450–4.500)

## 2024-01-01 ENCOUNTER — Encounter: Payer: Self-pay | Admitting: Internal Medicine

## 2024-01-01 ENCOUNTER — Ambulatory Visit: Payer: Self-pay | Admitting: Nurse Practitioner

## 2024-01-01 ENCOUNTER — Ambulatory Visit: Admitting: Internal Medicine

## 2024-01-01 VITALS — BP 130/80 | HR 73 | Ht 71.5 in | Wt 270.2 lb

## 2024-01-01 DIAGNOSIS — J209 Acute bronchitis, unspecified: Secondary | ICD-10-CM

## 2024-01-01 MED ORDER — HYDROCODONE BIT-HOMATROP MBR 5-1.5 MG/5ML PO SOLN: 5.0000 mL | Freq: Three times a day (TID) | ORAL | 0 refills | Status: DC | PRN

## 2024-01-01 MED ORDER — PREDNISONE 10 MG PO TABS: ORAL_TABLET | ORAL | 0 refills | Status: DC

## 2024-01-01 MED ORDER — ALBUTEROL SULFATE HFA 108 (90 BASE) MCG/ACT IN AERS: 2.0000 | INHALATION_SPRAY | Freq: Four times a day (QID) | RESPIRATORY_TRACT | 0 refills | Status: DC | PRN

## 2024-01-01 NOTE — Telephone Encounter (Signed)
 Patient will need an appointment to have any medication called in. Please call and schedule appointment.

## 2024-01-01 NOTE — Telephone Encounter (Signed)
  Chief Complaint: cough Symptoms: cough Frequency: since monday Pertinent Negatives: Patient denies fever, sob Disposition: [] ED /[] Urgent Care (no appt availability in office) / [x] Appointment(In office/virtual)/ []  [facility]/ [] Home Care/ [] Refused Recommended Disposition /[] [facility]/ []  Follow-up with PCP Additional Notes: Patient states that he has been having a cough since Monday which as gotten worse and he can't sleep at night. Coughing up clear phlegm. Patient states that he has tried DayQuil/NyQuil, humidifier and warm liquids with no improvement. Patient states that he does feel like he has some wheezing with the cough.    Copied from CRM [id number]. Topic: Clinical - Medical Advice >> Jan 01, 2024  8:13 AM Tiffany B wrote: Reason for CRM: Caller experiencing a cough and would like to see his PCP only. PCP has no available appointments until Monday. Patient seeking clinical advice Reason for Disposition  [1] Continuous (nonstop) coughing interferes with work or school AND [2] no improvement using cough treatment per Care Advice  Answer Assessment - Initial Assessment Questions 1. ONSET: "When did the cough begin?"      Monday 2. SEVERITY: "How bad is the cough today?"      Mod-severe 3. SPUTUM: "Describe the color of your sputum" (none, dry cough; clear, white, yellow, green)     clear 4. HEMOPTYSIS: "Are you coughing up any blood?" If so ask: "How much?" (flecks, streaks, tablespoons, etc.)     no 5. DIFFICULTY BREATHING: "Are you having difficulty breathing?" If Yes, ask: "How bad is it?" (e.g., mild, moderate, severe)    - MILD: No SOB at rest, mild SOB with walking, speaks normally in sentences, can lie down, no retractions, pulse < 100.    - MODERATE: SOB at rest, SOB with minimal exertion and prefers to sit, cannot lie down flat, speaks in phrases, mild retractions, audible wheezing, pulse 100-120.    - SEVERE: Very SOB at rest, speaks in single  words, struggling to breathe, sitting hunched forward, retractions, pulse > 120      no 6. FEVER: "Do you have a fever?" If Yes, ask: "What is your temperature, how was it measured, and when did it start?"     no 7. CARDIAC HISTORY: "Do you have any history of heart disease?" (e.g., heart attack, congestive heart failure)      no 8. LUNG HISTORY: "Do you have any history of lung disease?"  (e.g., pulmonary embolus, asthma, emphysema)     no 9. PE RISK FACTORS: "Do you have a history of blood clots?" (or: recent major surgery, recent prolonged travel, bedridden)     no 10. OTHER SYMPTOMS: "Do you have any other symptoms?" (e.g., runny nose, wheezing, chest pain)       Wheezing, runny nose  Protocols used: Cough - Acute Productive-A-AH

## 2024-01-01 NOTE — Progress Notes (Unsigned)
 Subjective:    Patient ID: Isaiah Terrell, male    DOB: 02-Nov-1970, 53 y.o.   MRN: [id number]  HPI  Discussed the use of AI scribe software for clinical note transcription with the patient, who gave verbal consent to proceed.   Isaiah Terrell is a 53 year old male who presents with a persistent cough.  He has been experiencing a persistent dry cough for the past four days, initially attributing it to allergies. The cough worsens at night and occasionally causes shortness of breath during coughing fits. No productive cough, fever, chills, or body aches are present, but there is rib pain due to the intensity of the coughing. He also reports occasional post-nasal drainage without significant sinus pressure, headache, or nasal congestion.  He has been taking Zyrtec since Monday without relief. He recalls a previous episode in the fall where he was prescribed promethazine DM syrup, which was ineffective. He has no history of asthma or smoking.     Review of Systems   Past Medical History:  Diagnosis Date   BMI 39.0-39.9,adult    Cervical radiculopathy    Hypertension     Current Outpatient Medications  Medication Sig Dispense Refill   amLODipine (NORVASC) 5 MG tablet Take 1 tablet (5 mg total) by mouth daily. 90 tablet 1   hydrochlorothiazide (HYDRODIURIL) 25 MG tablet Take 1 tablet (25 mg total) by mouth daily. 90 tablet 1   losartan (COZAAR) 100 MG tablet Take 1 tablet (100 mg total) by mouth daily. 90 tablet 1   metoprolol tartrate (LOPRESSOR) 25 MG tablet Take 1 tablet (25 mg total) by mouth daily. 90 tablet 1   rosuvastatin (CRESTOR) 10 MG tablet Take 1 tablet (10 mg total) by mouth daily. 90 tablet 1   sildenafil (VIAGRA) 50 MG tablet Take 1 tablet (50 mg total) by mouth daily as needed for erectile dysfunction. 30 tablet 5   triamcinolone cream (KENALOG) 0.1 % Apply 1 application topically 2 (two) times daily. 60 g 0   No current facility-administered medications for this visit.     Allergies  Allergen Reactions   Benazepril Cough   Amlodipine Other (See Comments)    Mild edema with 10 mg dose does fine with 5mg     Family History  Problem Relation Age of Onset   Cancer Mother        ovarian   Hypertension Mother    Hypertension Father    Stroke Father    Heart disease Father    Cancer Father        prostate    Social History   Socioeconomic History   Marital status: Married    Spouse name: Not on file   Number of children: Not on file   Years of education: Not on file   Highest education level: Not on file  Occupational History   Not on file  Tobacco Use   Smoking status: Never   Smokeless tobacco: Never  Vaping Use   Vaping status: Never Used  Substance and Sexual Activity   Alcohol use: No   Drug use: No   Sexual activity: Yes  Other Topics Concern   Not on file  Social History Narrative   Not on file   Social Drivers of Health   Financial Resource Strain: Not on file  Food Insecurity: Not on file  Transportation Needs: Not on file  Physical Activity: Not on file  Stress: Not on file  Social Connections: Not on file  Intimate Partner Violence:  Not on file     Constitutional: Denies fever, malaise, fatigue, headache or abrupt weight changes.  HEENT: Pt reports runny nose. Denies eye pain, eye redness, ear pain, ringing in the ears, wax buildup, nasal congestion, bloody nose, or sore throat. Respiratory: Patient reports cough and wheezing.  Denies difficulty breathing, shortness of breath, or sputum production.   Cardiovascular: Denies chest pain, chest tightness, palpitations or swelling in the hands or feet.  Gastrointestinal: Denies abdominal pain, bloating, constipation, diarrhea or blood in the stool.  Musculoskeletal: Denies decrease in range of motion, difficulty with gait, muscle pain or joint pain and swelling.   No other specific complaints in a complete review of systems (except as listed in HPI above).       Objective:   Physical Exam  BP 130/80   Pulse 73   Ht 5' 11.5" (1.816 m)   Wt 270 lb 3.2 oz (122.6 kg)   SpO2 95%   BMI 37.16 kg/m   Wt Readings from Last 3 Encounters:  11/04/23 270 lb (122.5 kg)  10/21/23 267 lb 3.2 oz (121.2 kg)  10/12/23 260 lb (117.9 kg)    General: Appears his stated age, obese, in NAD. HEENT: Head: normal shape and size, no sinus tenderness noted; Eyes: sclera white, no icterus, conjunctiva pink, PERRLA and EOMs intact; Nose: mucosa pink and moist, septum midline; Throat/Mouth: Teeth present, mucosa pink and moist, + PND, no exudate, lesions or ulcerations noted.  Neck: No adenopathy noted. Cardiovascular: Normal rate and rhythm.  Pulmonary/Chest: Normal effort and positive vesicular breath sounds with intermittent expiratory wheezing. No respiratory distress. No rales or ronchi noted.  Neurological: Alert and oriented.   BMET    Component Value Date/Time   NA 138 11/04/2023 0910   K 4.3 11/04/2023 0910   CL 102 11/04/2023 0910   CO2 23 11/04/2023 0910   GLUCOSE 110 (H) 11/04/2023 0910   BUN 14 11/04/2023 0910   CREATININE 0.95 11/04/2023 0910   CALCIUM 9.9 11/04/2023 0910   GFRNONAA 88 07/10/2020 0843   GFRAA 102 07/10/2020 0843    Lipid Panel     Component Value Date/Time   CHOL 140 11/04/2023 0910   TRIG 228 (H) 11/04/2023 0910   HDL 29 (L) 11/04/2023 0910   CHOLHDL 4.8 11/04/2023 0910   LDLCALC 73 11/04/2023 0910    CBC    Component Value Date/Time   WBC 4.9 11/04/2023 0910   RBC 5.55 11/04/2023 0910   HGB 16.3 11/04/2023 0910   HCT 49.4 11/04/2023 0910   PLT 231 11/04/2023 0910   MCV 89 11/04/2023 0910   MCH 29.4 11/04/2023 0910   MCHC 33.0 11/04/2023 0910   RDW 13.1 11/04/2023 0910   LYMPHSABS 1.6 11/04/2023 0910   EOSABS 0.1 11/04/2023 0910   BASOSABS 0.0 11/04/2023 0910    Hgb A1C Lab Results  Component Value Date   HGBA1C 6.1 (H) 11/04/2023            Assessment & Plan:   Assessment and Plan     Acute Bronchitis Dry cough with rib pain and wheezing. Likely viral. Antibiotics not indicated. Informed consent for prednisone obtained. - Prescribe prednisone for six-day taper. - Prescribe albuterol inhaler for airway opening and cough reduction. - Prescribe Hycodan syrup for nighttime cough management. Advise against use during work hours.    Follow-up with your PCP as previously scheduled Nicki Reaper, NP

## 2024-01-02 ENCOUNTER — Encounter: Payer: Self-pay | Admitting: Internal Medicine

## 2024-01-02 NOTE — Telephone Encounter (Signed)
 Patient was seen 01-01-24

## 2024-01-02 NOTE — Patient Instructions (Signed)
 Acute Bronchitis, Adult  Acute bronchitis is when air tubes in the lungs (bronchi) suddenly get swollen. The condition can make it hard for you to breathe. In adults, acute bronchitis usually goes away within 2 weeks. A cough caused by bronchitis may last up to 3 weeks. Smoking, allergies, and asthma can make the condition worse. What are the causes? Germs that cause cold and flu (viruses). The most common cause of this condition is the virus that causes the common cold. Bacteria. Substances that bother (irritate) the lungs, including: Smoke from cigarettes and other types of tobacco. Dust and pollen. Fumes from chemicals, gases, or burned fuel. Indoor or outdoor air pollution. What increases the risk? A weak body's defense system. This is also called the immune system. Any condition that affects your lungs and breathing, such as asthma. What are the signs or symptoms? A cough. Coughing up clear, yellow, or green mucus. Making high-pitched whistling sounds when you breathe, most often when you breathe out (wheezing). Runny or stuffy nose. Having too much mucus in your lungs (chest congestion). Shortness of breath. Body aches. A sore throat. How is this treated? Acute bronchitis may go away over time without treatment. Your doctor may tell you to: Drink more fluids. This will help thin your mucus so it is easier to cough up. Use a device that gets medicine into your lungs (inhaler). Use a vaporizer or a humidifier. These are machines that add water to the air. This helps with coughing and poor breathing. Take a medicine that thins mucus and helps clear it from your lungs. Take a medicine that prevents or stops coughing. It is not common to take an antibiotic medicine for this condition. Follow these instructions at home:  Take over-the-counter and prescription medicines only as told by your doctor. Use an inhaler, vaporizer, or humidifier as told by your doctor. Take two teaspoons  (10 mL) of honey at bedtime. This helps lessen your coughing at night. Drink enough fluid to keep your pee (urine) pale yellow. Do not smoke or use any products that contain nicotine or tobacco. If you need help quitting, ask your doctor. Get a lot of rest. Return to your normal activities when your doctor says that it is safe. Keep all follow-up visits. How is this prevented?  Wash your hands often with soap and water for at least 20 seconds. If you cannot use soap and water, use hand sanitizer. Avoid contact with people who have cold symptoms. Try not to touch your mouth, nose, or eyes with your hands. Avoid breathing in smoke or chemical fumes. Make sure to get the flu shot every year. Contact a doctor if: Your symptoms do not get better in 2 weeks. You have trouble coughing up the mucus. Your cough keeps you awake at night. You have a fever. Get help right away if: You cough up blood. You have chest pain. You have very bad shortness of breath. You faint or keep feeling like you are going to faint. You have a very bad headache. Your fever or chills get worse. These symptoms may be an emergency. Get help right away. Call your local emergency services (911 in the [place] Do not wait to see if the symptoms will go away. Do not drive yourself to the hospital. Summary Acute bronchitis is when air tubes in the lungs (bronchi) suddenly get swollen. In adults, acute bronchitis usually goes away within 2 weeks. Drink more fluids. This will help thin your mucus so it is easier  to cough up. Take over-the-counter and prescription medicines only as told by your doctor. Contact a doctor if your symptoms do not improve after 2 weeks of treatment. This information is not intended to replace advice given to you by your health care provider. Make sure you discuss any questions you have with your health care provider. Document Revised: 01/31/2021 Document Reviewed: 01/31/2021 Elsevier Patient  Education  2024 [employer].

## 2024-02-01 ENCOUNTER — Emergency Department

## 2024-02-01 ENCOUNTER — Other Ambulatory Visit: Payer: Self-pay

## 2024-02-01 ENCOUNTER — Emergency Department: Admission: EM | Admit: 2024-02-01 | Discharge: 2024-02-01 | Disposition: A | Discharge status: Home / Self Care

## 2024-02-01 DIAGNOSIS — Y93B9 Activity, other involving muscle strengthening exercises: Secondary | ICD-10-CM | POA: Diagnosis not present

## 2024-02-01 DIAGNOSIS — M545 Low back pain, unspecified: Secondary | ICD-10-CM

## 2024-02-01 DIAGNOSIS — I10 Essential (primary) hypertension: Secondary | ICD-10-CM | POA: Insufficient documentation

## 2024-02-01 DIAGNOSIS — X501XXA Overexertion from prolonged static or awkward postures, initial encounter: Secondary | ICD-10-CM | POA: Diagnosis not present

## 2024-02-01 DIAGNOSIS — M5126 Other intervertebral disc displacement, lumbar region: Secondary | ICD-10-CM | POA: Insufficient documentation

## 2024-02-01 HISTORY — DX: Other intervertebral disc degeneration, lumbar region without mention of lumbar back pain or lower extremity pain: M51.369

## 2024-02-01 LAB — CBC WITH DIFFERENTIAL/PLATELET
Abs Immature Granulocytes: 0.04 10*3/uL (ref 0.00–0.07)
Basophils Absolute: 0 10*3/uL (ref 0.0–0.1)
Basophils Relative: 1 %
Eosinophils Absolute: 0.1 10*3/uL (ref 0.0–0.5)
Eosinophils Relative: 1 %
HCT: 48.2 % (ref 39.0–52.0)
Hemoglobin: 16.3 g/dL (ref 13.0–17.0)
Immature Granulocytes: 1 %
Lymphocytes Relative: 30 %
Lymphs Abs: 1.9 10*3/uL (ref 0.7–4.0)
MCH: 29.7 pg (ref 26.0–34.0)
MCHC: 33.8 g/dL (ref 30.0–36.0)
MCV: 87.8 fL (ref 80.0–100.0)
Monocytes Absolute: 0.3 10*3/uL (ref 0.1–1.0)
Monocytes Relative: 5 %
Neutro Abs: 3.9 10*3/uL (ref 1.7–7.7)
Neutrophils Relative %: 62 %
Platelets: 239 10*3/uL (ref 150–400)
RBC: 5.49 MIL/uL (ref 4.22–5.81)
RDW: 12.7 % (ref 11.5–15.5)
WBC: 6.2 10*3/uL (ref 4.0–10.5)
nRBC: 0 % (ref 0.0–0.2)

## 2024-02-01 LAB — BASIC METABOLIC PANEL WITH GFR
Anion gap: 7 (ref 5–15)
BUN: 19 mg/dL (ref 6–20)
CO2: 25 mmol/L (ref 22–32)
Calcium: 9.7 mg/dL (ref 8.9–10.3)
Chloride: 107 mmol/L (ref 98–111)
Creatinine, Ser: 0.9 mg/dL (ref 0.61–1.24)
GFR, Estimated: >60 mL/min (ref >60)
Glucose, Bld: 107 mg/dL — ABNORMAL HIGH (ref 70–99)
Potassium: 4.1 mmol/L (ref 3.5–5.1)
Sodium: 139 mmol/L (ref 135–145)

## 2024-02-01 MED ORDER — HYDROMORPHONE HCL 1 MG/ML IJ SOLN
1.0000 mg | Freq: Once | INTRAMUSCULAR | Status: AC
  Administered 2024-02-01: 1 mg via INTRAVENOUS
  Filled 2024-02-01: qty 1

## 2024-02-01 MED ORDER — ACETAMINOPHEN 500 MG PO TABS: 1000.0000 mg | ORAL_TABLET | Freq: Four times a day (QID) | ORAL | 2 refills | Status: DC | PRN

## 2024-02-01 MED ORDER — CYCLOBENZAPRINE HCL 10 MG PO TABS
5.0000 mg | ORAL_TABLET | Freq: Once | ORAL | Status: AC
  Administered 2024-02-01: 5 mg via ORAL
  Filled 2024-02-01: qty 1

## 2024-02-01 MED ORDER — OXYCODONE HCL 5 MG PO TABS: 5.0000 mg | ORAL_TABLET | Freq: Three times a day (TID) | ORAL | 0 refills | Status: DC | PRN

## 2024-02-01 MED ORDER — CYCLOBENZAPRINE HCL 5 MG PO TABS: 10.0000 mg | ORAL_TABLET | Freq: Three times a day (TID) | ORAL | 0 refills | Status: DC | PRN

## 2024-02-01 MED ORDER — IBUPROFEN 800 MG PO TABS: 800.0000 mg | ORAL_TABLET | Freq: Three times a day (TID) | ORAL | 0 refills | Status: DC | PRN

## 2024-02-01 MED ORDER — KETOROLAC TROMETHAMINE 15 MG/ML IJ SOLN
15.0000 mg | Freq: Once | INTRAMUSCULAR | Status: AC
  Administered 2024-02-01: 15 mg via INTRAVENOUS
  Filled 2024-02-01: qty 1

## 2024-02-01 MED ORDER — OXYCODONE HCL 5 MG PO TABS
5.0000 mg | ORAL_TABLET | Freq: Once | ORAL | Status: AC
  Administered 2024-02-01: 5 mg via ORAL
  Filled 2024-02-01: qty 1

## 2024-02-01 MED ORDER — ACETAMINOPHEN 500 MG PO TABS
1000.0000 mg | ORAL_TABLET | Freq: Once | ORAL | Status: AC
  Administered 2024-02-01: 1000 mg via ORAL
  Filled 2024-02-01: qty 2

## 2024-02-01 MED ORDER — MORPHINE SULFATE (PF) 4 MG/ML IV SOLN
4.0000 mg | Freq: Once | INTRAVENOUS | Status: AC
  Administered 2024-02-01: 4 mg via INTRAVENOUS
  Filled 2024-02-01: qty 1

## 2024-02-01 MED ORDER — LIDOCAINE 5 % EX PTCH: 1.0000 | MEDICATED_PATCH | CUTANEOUS | 0 refills | Status: DC

## 2024-02-01 NOTE — Discharge Instructions (Signed)
 Your evaluation in the emergency department was overall reassuring.  As discussed, we do see a low back disc protrusion that was seen before, but it is not compressing on your spinal cord.  I do recommend you follow-up closely with your primary care doctor, I prescribed you several pain medications to use as needed for any ongoing discomfort.  Return to the emergency department with any new or worsening symptoms.

## 2024-02-01 NOTE — ED Notes (Signed)
 See triage notes. Patient c/o back pain since yesterday. Patient has a history of a herniated disc.

## 2024-02-01 NOTE — ED Triage Notes (Signed)
 Pt to ED with wife via POV for severe lower back pain since yesterday while doing PT. Pt was diagnosed with herniated discs in January. Very hard to get pt out of care due to severe pain. Denies new incontinence or leg weakness.

## 2024-02-01 NOTE — ED Provider Notes (Addendum)
 [facility] Provider Note    Event Date/Time   First MD Initiated Contact with Patient 02/01/24 1323     (approximate)   History   herniated disc and Back Pain  Pt to ED with wife via POV for severe lower back pain since yesterday while doing PT. Pt was diagnosed with herniated discs in January. Very hard to get pt out of care due to severe pain. Denies new incontinence or leg weakness.    HPI Isaiah Terrell is a 53 y.o. male PMH hypertension, obesity, hyperlipidemia, left foraminal and extraforaminal disc protrusion at L3-L4 (per review of MRI on 10/13/2023) presents for evaluation of low back pain - Patient was moving yesterday.  Contrary to triage note, was not at physical therapy or doing stretching exercises but does note that he twisted his torso in a strange position.  Developed left back pain similar to last time he was here in December 2024.  No significant pain radiation.  Took 600 mg ibuprofen  at about 5 AM today with minimal relief.  No lower extremity weakness or tingling or numbness.  No bowel or urinary incontinence or retention.  Pain is left paraspinal. -No preceding trauma -No fever, not on blood thinners     Physical Exam   Triage Vital Signs: ED Triage Vitals  Encounter Vitals Group     BP 02/01/24 1318 (!) 148/88     Systolic BP Percentile --      Diastolic BP Percentile --      Pulse Rate 02/01/24 1318 75     Resp 02/01/24 1318 20     Temp 02/01/24 1318 98 F (36.7 C)     Temp Source 02/01/24 1318 Oral     SpO2 02/01/24 1318 97 %     Weight 02/01/24 1315 270 lb (122.5 kg)     Height 02/01/24 1315 5\' 11"  (1.803 m)     Head Circumference --      Peak Flow --      Pain Score 02/01/24 1315 10     Pain Loc --      Pain Education --      Exclude from Growth Chart --     Most recent vital signs: Vitals:   02/01/24 1318 02/01/24 1738  BP: (!) 148/88 (!) 144/86  Pulse: 75 74  Resp: 20 18  Temp: 98 F (36.7 C) 98.1 F (36.7 C)   SpO2: 97% 98%     General: Awake, no distress.  CV:  Good peripheral perfusion. RRR, RP 2+ Resp:  Normal effort. CTAB Abd:  No distention. Nontender to deep palpation throughout EXT:  Normal strength bilateral lower extremities   ED Results / Procedures / Treatments   Labs (all labs ordered are listed, but only abnormal results are displayed) Labs Reviewed  BASIC METABOLIC PANEL WITH GFR - Abnormal; Notable for the following components:      Result Value   Glucose, Bld 107 (*)    All other components within normal limits  CBC WITH DIFFERENTIAL/PLATELET  URINALYSIS, COMPLETE (UACMP) WITH MICROSCOPIC     EKG  N/a   RADIOLOGY See ED course below    PROCEDURES:  Critical Care performed: No  Procedures   MEDICATIONS ORDERED IN ED: Medications  morphine  (PF) 4 MG/ML injection 4 mg (4 mg Intravenous Given 02/01/24 1358)  ketorolac  (TORADOL ) 15 MG/ML injection 15 mg (15 mg Intravenous Given 02/01/24 1358)  acetaminophen  (TYLENOL ) tablet 1,000 mg (1,000 mg Oral Given 02/01/24 1358)  cyclobenzaprine  (  FLEXERIL ) tablet 5 mg (5 mg Oral Given 02/01/24 1359)  HYDROmorphone  (DILAUDID ) injection 1 mg (1 mg Intravenous Given 02/01/24 1456)  ketorolac  (TORADOL ) 15 MG/ML injection 15 mg (15 mg Intravenous Given 02/01/24 1457)  HYDROmorphone  (DILAUDID ) injection 1 mg (1 mg Intravenous Given 02/01/24 1526)  HYDROmorphone  (DILAUDID ) injection 1 mg (1 mg Intravenous Given 02/01/24 1731)     IMPRESSION / MDM / ASSESSMENT AND PLAN / ED COURSE  I reviewed the triage vital signs and the nursing notes.                              DDX/MDM/AP: Differential diagnosis includes, but is not limited to, muscle strain, do suspect some component of known foraminal and extraforaminal disc protrusion contributing to pain, no back pain red flag symptoms on my eval however.  Doubt renal/urinary pathology.  Plan: - Pain control next-basic screening labs - No indication for emergent repeat imaging at  this time though will reassess after attempts at pain management  Patient did require several rounds of pain medications, at which point did escalate to MRI given his inability to ambulate due to pain despite initially reassuring exam.  Patient's presentation is most consistent with acute presentation with potential threat to life or bodily function.   ED course below.  After multiple rounds of pain medications, MRI did show similar findings to previous which show L3-4 disc protrusion though no central canal stenosis.  Overall consistent with patient's presentation.  No evidence of acute pathology at this time.  Pain adequately controlled in emergency department, patient declined admission for pain control.  Discharge home with multimodal pain management including Tylenol , Motrin , Flexeril , oxycodone .  Plan for close PMD follow-up.  ED return precautions in place.  Patient agrees with plan.  Clinical Course as of 02/01/24 1844  Sun Feb 01, 2024  1410 Patient evaluated about 5 minutes after pain medication administration, still with significant pain, still states too painful to stand but confirms he did walk to his car to come to the hospital--will reevaluate again shortly [MM]  1422 Cbc wnl [MM]  1428 Bmp reviewed, unremarkable [MM]  1446 Reevaluated, pain still 8/10, states still too uncomfortable to stand or move leg sufficiently to do further strength testing.  Will give further round of pain medication and plan for repeat MRI to eval for interval change in known disc protrusion. [MM]  1802 MRI with no obvious canal stenosis on my read, formal read below  IMPRESSION: 1. Shallow far left lateral disc protrusion and annular tear at L3-4 may contact the left L3 nerve root beyond the foramen. This is similar the prior study. 2. Mild disc bulging at L2-3, L4-5, and L5-S1 without significant stenosis or change. 3. No significant new or progressive disease in the lumbar spine.   [MM]  1833  Patient able to ambulate here in emergency department, motivated for discharge home.  Reassured by MRI findings.  Still in significant pain though declines admission for pain management and PT OT.  Will proceed with discharge home and plan for close outpatient follow-up.  ED return precautions in place.  Patient agrees with plan. [MM]    Clinical Course User Index [MM] Collis Deaner, MD     FINAL CLINICAL IMPRESSION(S) / ED DIAGNOSES   Final diagnoses:  Left-sided low back pain without sciatica, unspecified chronicity  Protrusion of lumbar intervertebral disc     Rx / DC Orders   ED Discharge Orders  Ordered    acetaminophen  (TYLENOL ) 500 MG tablet  Every 6 hours PRN        02/01/24 1840    ibuprofen  (ADVIL ) 800 MG tablet  Every 8 hours PRN        02/01/24 1840    cyclobenzaprine  (FLEXERIL ) 5 MG tablet  3 times daily PRN        02/01/24 1840    lidocaine  (LIDODERM ) 5 %  Every 24 hours,   Status:  Discontinued        02/01/24 1840    oxyCODONE  (ROXICODONE ) 5 MG immediate release tablet  Every 8 hours PRN,   Status:  Discontinued        02/01/24 1840    oxyCODONE  (ROXICODONE ) 5 MG immediate release tablet  Every 8 hours PRN        02/01/24 1842             Note:  This document was prepared using Dragon voice recognition software and may include unintentional dictation errors.   Collis Deaner, MD 02/01/24 1844    Collis Deaner, MD 02/01/24 [phone]

## 2024-02-23 ENCOUNTER — Ambulatory Visit: Payer: Self-pay

## 2024-02-23 NOTE — Telephone Encounter (Signed)
 Copied from CRM [id number]. Topic: Clinical - Medical Advice >> Feb 23, 2024 10:40 AM Isaiah Terrell wrote: Reason for CRM: patient called stated he has sore throat, achy body and cough. Patient would like provider to call something in for him  Chief Complaint: sore throat, cough, headache Symptoms: see above Frequency: started yesterday Pertinent Negatives: Patient denies sob Disposition: [] ED /[] Urgent Care (no appt availability in office) / [x] Appointment(In office/virtual)/ []  [facility]/ [] Home Care/ [] Refused Recommended Disposition /[] [facility]/ []  Follow-up with PCP Additional Notes: patient very upset that he was instructed to make an apt to be seen for headache, sore throat, fever, body aches; apt made but wants pcp to call in medication. Care advice given, denies questions; instructed to go to ER if becomes worse.   Reason for Disposition  SEVERE (e.g., excruciating) throat pain  Answer Assessment - Initial Assessment Questions 1. ONSET: "When did the throat start hurting?" (Hours or days ago)      Sore throat and cough, yesterday start 2. SEVERITY: "How bad is the sore throat?" (Scale 1-10; mild, moderate or severe)   - MILD (1-3):  Doesn'Terrell interfere with eating or normal activities.   - MODERATE (4-7): Interferes with eating some solids and normal activities.   - SEVERE (8-10):  Excruciating pain, interferes with most normal activities.   - SEVERE WITH DYSPHAGIA (10): Can'Terrell swallow liquids, drooling.     severe 3. STREP EXPOSURE: "Has there been any exposure to strep within the past week?" If Yes, ask: "What type of contact occurred?"      denies 4.  VIRAL SYMPTOMS: "Are there any symptoms of a cold, such as a runny nose, cough, hoarse voice or red eyes?"      Cough, runny nose 5. FEVER: "Do you have a fever?" If Yes, ask: "What is your temperature, how was it measured, and when did it start?"     yes 6. PUS ON THE TONSILS: "Is there pus on the tonsils in  the back of your throat?"     no 7. OTHER SYMPTOMS: "Do you have any other symptoms?" (e.g., difficulty breathing, headache, rash)     headache 8. PREGNANCY: "Is there any chance you are pregnant?" "When was your last menstrual period?"     na  Protocols used: Sore Throat-A-AH

## 2024-02-24 ENCOUNTER — Ambulatory Visit: Admitting: Nurse Practitioner

## 2024-02-27 ENCOUNTER — Encounter: Payer: Self-pay | Admitting: Nurse Practitioner

## 2024-05-06 ENCOUNTER — Encounter: Payer: Self-pay | Admitting: Nurse Practitioner

## 2024-05-06 ENCOUNTER — Ambulatory Visit: Payer: Self-pay | Admitting: Nurse Practitioner

## 2024-05-06 VITALS — BP 122/72 | HR 64 | Temp 98.7°F | Resp 17 | Ht 70.98 in | Wt 268.2 lb

## 2024-05-06 DIAGNOSIS — R7303 Prediabetes: Secondary | ICD-10-CM | POA: Diagnosis not present

## 2024-05-06 DIAGNOSIS — E038 Other specified hypothyroidism: Secondary | ICD-10-CM | POA: Diagnosis not present

## 2024-05-06 DIAGNOSIS — E66812 Obesity, class 2: Secondary | ICD-10-CM | POA: Diagnosis not present

## 2024-05-06 DIAGNOSIS — E782 Mixed hyperlipidemia: Secondary | ICD-10-CM | POA: Diagnosis not present

## 2024-05-06 DIAGNOSIS — Z6835 Body mass index (BMI) 35.0-35.9, adult: Secondary | ICD-10-CM

## 2024-05-06 DIAGNOSIS — I10 Essential (primary) hypertension: Secondary | ICD-10-CM

## 2024-05-06 MED ORDER — LOSARTAN POTASSIUM 100 MG PO TABS: 100.0000 mg | ORAL_TABLET | Freq: Every day | ORAL | 1 refills | Status: AC

## 2024-05-06 MED ORDER — CYCLOBENZAPRINE HCL 5 MG PO TABS: 10.0000 mg | ORAL_TABLET | Freq: Three times a day (TID) | ORAL | 0 refills | Status: AC | PRN

## 2024-05-06 MED ORDER — HYDROCHLOROTHIAZIDE 25 MG PO TABS
25.0000 mg | ORAL_TABLET | Freq: Every day | ORAL | 1 refills | Status: AC
Start: 2024-05-06 — End: ?

## 2024-05-06 MED ORDER — IBUPROFEN 800 MG PO TABS: 800.0000 mg | ORAL_TABLET | Freq: Three times a day (TID) | ORAL | 0 refills | Status: AC | PRN

## 2024-05-06 MED ORDER — ROSUVASTATIN CALCIUM 10 MG PO TABS: 10.0000 mg | ORAL_TABLET | Freq: Every day | ORAL | 1 refills | Status: AC

## 2024-05-06 MED ORDER — AMLODIPINE BESYLATE 5 MG PO TABS: 5.0000 mg | ORAL_TABLET | Freq: Every day | ORAL | 1 refills | Status: AC

## 2024-05-06 MED ORDER — METOPROLOL TARTRATE 25 MG PO TABS: 25.0000 mg | ORAL_TABLET | Freq: Every day | ORAL | 1 refills | Status: AC

## 2024-05-06 NOTE — Assessment & Plan Note (Signed)
 Recommended eating smaller high protein, low fat meals more frequently and exercising 30 mins a day 5 times a week with a goal of 10-15lb weight loss in the next 3 months.

## 2024-05-06 NOTE — Assessment & Plan Note (Signed)
Chronic.  Controlled.  Continue with current medication regimen with Metoprolol 25mg, Losartan 100mg, and Amlodipine 5mg.  Refills sent today.  Labs ordered today.  Return to clinic in 6 months for reevaluation.  Call sooner if concerns arise.   

## 2024-05-06 NOTE — Assessment & Plan Note (Signed)
 Labs ordered at visit today.  Will make recommendations based on lab results.

## 2024-05-06 NOTE — Progress Notes (Signed)
 BP 122/72 (BP Location: Right Arm, Patient Position: Sitting, Cuff Size: Large)   Pulse 64   Temp 98.7 F (37.1 C) (Oral)   Resp 17   Ht 5' 10.98 (1.803 m)   Wt 268 lb 3.2 oz (121.7 kg)   SpO2 95%   BMI 37.42 kg/m    Subjective:    Patient ID: Isaiah Terrell, male    DOB: [birth year], 53 y.o.   MRN: [id number]  HPI: Isaiah Terrell is a 53 y.o. male  Chief Complaint  Patient presents with   Follow-up    6 month follow up for his blood pressure. No concerns.    HYPERTENSION / HYPERLIPIDEMIA Satisfied with current treatment? no Duration of hypertension: years BP monitoring frequency: rarely BP range: 120/70 BP medication side effects: no Past BP meds: amlodipine , HCTZ and losartan  (cozaar ) Duration of hyperlipidemia: years Cholesterol medication side effects: no Cholesterol supplements: none Past cholesterol medications: rosuvastatin   Medication compliance: none Aspirin: no Recent stressors: no Recurrent headaches: no Visual changes: no Palpitations: no Dyspnea: no Chest pain: no Lower extremity edema: no Dizzy/lightheaded: no  The 10-year ASCVD risk score (Arnett DK, et al., 2019) is: 5.9%   Values used to calculate the score:     Age: 49 years     Clincally relevant sex: Male     Is Non-Hispanic African American: No     Diabetic: No     Tobacco smoker: No     Systolic Blood Pressure: 122 mmHg     Is BP treated: Yes     HDL Cholesterol: 29 mg/dL     Total Cholesterol: 140 mg/dL  Relevant past medical, surgical, family and social history reviewed and updated as indicated. Interim medical history since our last visit reviewed. Allergies and medications reviewed and updated.  Review of Systems  Eyes:  Negative for visual disturbance.  Respiratory:  Negative for chest tightness and shortness of breath.   Cardiovascular:  Negative for chest pain, palpitations and leg swelling.  Neurological:  Negative for dizziness, light-headedness and headaches.    Per HPI  unless specifically indicated above     Objective:    BP 122/72 (BP Location: Right Arm, Patient Position: Sitting, Cuff Size: Large)   Pulse 64   Temp 98.7 F (37.1 C) (Oral)   Resp 17   Ht 5' 10.98 (1.803 m)   Wt 268 lb 3.2 oz (121.7 kg)   SpO2 95%   BMI 37.42 kg/m   Wt Readings from Last 3 Encounters:  05/06/24 268 lb 3.2 oz (121.7 kg)  02/01/24 270 lb (122.5 kg)  01/01/24 270 lb 3.2 oz (122.6 kg)    Physical Exam Vitals and nursing note reviewed.  Constitutional:      General: He is not in acute distress.    Appearance: Normal appearance. He is not ill-appearing, toxic-appearing or diaphoretic.  HENT:     Head: Normocephalic.     Right Ear: External ear normal.     Left Ear: External ear normal.     Nose: Nose normal. No congestion or rhinorrhea.     Mouth/Throat:     Mouth: Mucous membranes are moist.  Eyes:     General:        Right eye: No discharge.        Left eye: No discharge.     Extraocular Movements: Extraocular movements intact.     Conjunctiva/sclera: Conjunctivae normal.     Pupils: Pupils are equal, round, and reactive to light.  Cardiovascular:  Rate and Rhythm: Normal rate and regular rhythm.     Heart sounds: No murmur heard. Pulmonary:     Effort: Pulmonary effort is normal. No respiratory distress.     Breath sounds: Normal breath sounds. No wheezing, rhonchi or rales.  Abdominal:     General: Abdomen is flat. Bowel sounds are normal.  Musculoskeletal:     Cervical back: Normal range of motion and neck supple.  Skin:    General: Skin is warm and dry.     Capillary Refill: Capillary refill takes less than 2 seconds.  Neurological:     General: No focal deficit present.     Mental Status: He is alert and oriented to person, place, and time.  Psychiatric:        Mood and Affect: Mood normal.        Behavior: Behavior normal.        Thought Content: Thought content normal.        Judgment: Judgment normal.     Results for orders  placed or performed during the hospital encounter of 02/01/24  Basic metabolic panel   Collection Time: 02/01/24  1:49 PM  Result Value Ref Range   Sodium 139 135 - 145 mmol/L   Potassium 4.1 3.5 - 5.1 mmol/L   Chloride 107 98 - 111 mmol/L   CO2 25 22 - 32 mmol/L   Glucose, Bld 107 (H) 70 - 99 mg/dL   BUN 19 6 - 20 mg/dL   Creatinine, Ser 9.09 0.61 - 1.24 mg/dL   Calcium  9.7 8.9 - 10.3 mg/dL   GFR, Estimated >39 >39 mL/min   Anion gap 7 5 - 15  CBC with Differential   Collection Time: 02/01/24  1:49 PM  Result Value Ref Range   WBC 6.2 4.0 - 10.5 K/uL   RBC 5.49 4.22 - 5.81 MIL/uL   Hemoglobin 16.3 13.0 - 17.0 g/dL   HCT 51.7 60.9 - 47.9 %   MCV 87.8 80.0 - 100.0 fL   MCH 29.7 26.0 - 34.0 pg   MCHC 33.8 30.0 - 36.0 g/dL   RDW 87.2 88.4 - 84.4 %   Platelets 239 150 - 400 K/uL   nRBC 0.0 0.0 - 0.2 %   Neutrophils Relative % 62 %   Neutro Abs 3.9 1.7 - 7.7 K/uL   Lymphocytes Relative 30 %   Lymphs Abs 1.9 0.7 - 4.0 K/uL   Monocytes Relative 5 %   Monocytes Absolute 0.3 0.1 - 1.0 K/uL   Eosinophils Relative 1 %   Eosinophils Absolute 0.1 0.0 - 0.5 K/uL   Basophils Relative 1 %   Basophils Absolute 0.0 0.0 - 0.1 K/uL   Immature Granulocytes 1 %   Abs Immature Granulocytes 0.04 0.00 - 0.07 K/uL      Assessment & Plan:   Problem List Items Addressed This Visit       Cardiovascular and Mediastinum   Essential hypertension   Chronic.  Controlled.  Continue with current medication regimen with Metoprolol  25mg , Losartan  100mg , and Amlodipine  5mg .  Refills sent today.  Labs ordered today.  Return to clinic in 6 months for reevaluation.  Call sooner if concerns arise.       Relevant Medications   amLODipine  (NORVASC ) 5 MG tablet   hydrochlorothiazide  (HYDRODIURIL ) 25 MG tablet   losartan  (COZAAR ) 100 MG tablet   metoprolol  tartrate (LOPRESSOR ) 25 MG tablet   rosuvastatin  (CRESTOR ) 10 MG tablet     Endocrine   Subclinical hypothyroidism -  Primary   Controlled. Labs  ordered today. Will make recommendations based on lab results. Controlled without medications.        Relevant Medications   metoprolol  tartrate (LOPRESSOR ) 25 MG tablet   Other Relevant Orders   T4   TSH     Other   Obesity   Recommended eating smaller high protein, low fat meals more frequently and exercising 30 mins a day 5 times a week with a goal of 10-15lb weight loss in the next 3 months.       Hyperlipidemia   Chronic.  Controlled.  Continue with current medication regimen of Crestor  10mg  daily.  Recommend a low fat diet and exercise.  Refills sent today.  Labs ordered today.  Return to clinic in 6 months for reevaluation.  Call sooner if concerns arise.       Relevant Medications   amLODipine  (NORVASC ) 5 MG tablet   hydrochlorothiazide  (HYDRODIURIL ) 25 MG tablet   losartan  (COZAAR ) 100 MG tablet   metoprolol  tartrate (LOPRESSOR ) 25 MG tablet   rosuvastatin  (CRESTOR ) 10 MG tablet   Other Relevant Orders   Lipid panel   Prediabetes   Labs ordered at visit today.  Will make recommendations based on lab results.        Relevant Orders   Comprehensive metabolic panel with GFR   Hemoglobin A1c     Follow up plan: Return in about 6 months (around 11/06/2024) for Physical and Fasting labs.

## 2024-05-06 NOTE — Assessment & Plan Note (Signed)
 Chronic.  Controlled.  Continue with current medication regimen of Crestor  10mg  daily.  Recommend a low fat diet and exercise.  Refills sent today.  Labs ordered today.  Return to clinic in 6 months for reevaluation.  Call sooner if concerns arise.

## 2024-05-06 NOTE — Assessment & Plan Note (Signed)
 Controlled. Labs ordered today. Will make recommendations based on lab results. Controlled without medications.

## 2024-05-07 ENCOUNTER — Ambulatory Visit: Payer: Self-pay | Admitting: Nurse Practitioner

## 2024-05-07 LAB — HEMOGLOBIN A1C
Est. average glucose Bld gHb Est-mCnc: 123 mg/dL
Hgb A1c MFr Bld: 5.9 % — ABNORMAL HIGH (ref 4.8–5.6)

## 2024-05-07 LAB — COMPREHENSIVE METABOLIC PANEL WITH GFR
ALT: 56 IU/L — ABNORMAL HIGH (ref 0–44)
AST: 38 IU/L (ref 0–40)
Albumin: 4.4 g/dL (ref 3.8–4.9)
Alkaline Phosphatase: 70 IU/L (ref 44–121)
BUN/Creatinine Ratio: 20 (ref 9–20)
BUN: 21 mg/dL (ref 6–24)
Bilirubin Total: 0.3 mg/dL (ref 0.0–1.2)
CO2: 19 mmol/L — ABNORMAL LOW (ref 20–29)
Calcium: 10.1 mg/dL (ref 8.7–10.2)
Chloride: 105 mmol/L (ref 96–106)
Creatinine, Ser: 1.07 mg/dL (ref 0.76–1.27)
Globulin, Total: 2 g/dL (ref 1.5–4.5)
Glucose: 134 mg/dL — ABNORMAL HIGH (ref 70–99)
Potassium: 4 mmol/L (ref 3.5–5.2)
Sodium: 141 mmol/L (ref 134–144)
Total Protein: 6.4 g/dL (ref 6.0–8.5)
eGFR: 82 mL/min/1.73 (ref >59)

## 2024-05-07 LAB — LIPID PANEL
Chol/HDL Ratio: 4.4 ratio (ref 0.0–5.0)
Cholesterol, Total: 105 mg/dL (ref 100–199)
HDL: 24 mg/dL — ABNORMAL LOW (ref >39)
LDL Chol Calc (NIH): 43 mg/dL (ref 0–99)
Triglycerides: 243 mg/dL — ABNORMAL HIGH (ref 0–149)
VLDL Cholesterol Cal: 38 mg/dL (ref 5–40)

## 2024-05-07 LAB — T4: T4, Total: 8.5 ug/dL (ref 4.5–12.0)

## 2024-05-07 LAB — TSH: TSH: 2.71 u[IU]/mL (ref 0.450–4.500)

## 2024-11-09 ENCOUNTER — Encounter: Admitting: Nurse Practitioner

## 2024-12-22 ENCOUNTER — Encounter: Admitting: Nurse Practitioner
